# Patient Record
Sex: Female | Born: 1955 | Race: White | Hispanic: No | Smoking: Never smoker
Health system: Southern US, Community
[De-identification: ages and names within clinical notes are randomized; demographics above are authoritative.]

## PROBLEM LIST (undated history)

## (undated) DIAGNOSIS — K219 Gastro-esophageal reflux disease without esophagitis: Secondary | ICD-10-CM

## (undated) DIAGNOSIS — F419 Anxiety disorder, unspecified: Secondary | ICD-10-CM

## (undated) DIAGNOSIS — I10 Essential (primary) hypertension: Secondary | ICD-10-CM

## (undated) DIAGNOSIS — M1990 Unspecified osteoarthritis, unspecified site: Secondary | ICD-10-CM

## (undated) DIAGNOSIS — R011 Cardiac murmur, unspecified: Secondary | ICD-10-CM

## (undated) DIAGNOSIS — J45909 Unspecified asthma, uncomplicated: Secondary | ICD-10-CM

## (undated) DIAGNOSIS — M5430 Sciatica, unspecified side: Secondary | ICD-10-CM

## (undated) DIAGNOSIS — S3992XA Unspecified injury of lower back, initial encounter: Secondary | ICD-10-CM

## (undated) HISTORY — PX: BREAST BIOPSY: SHX20

## (undated) HISTORY — PX: TONSILLECTOMY: SUR1361

## (undated) HISTORY — DX: Gastro-esophageal reflux disease without esophagitis: K21.9

## (undated) HISTORY — DX: Sciatica, unspecified side: M54.30

## (undated) HISTORY — DX: Anxiety disorder, unspecified: F41.9

## (undated) HISTORY — DX: Cardiac murmur, unspecified: R01.1

## (undated) HISTORY — DX: Unspecified osteoarthritis, unspecified site: M19.90

## (undated) HISTORY — PX: ABDOMINAL HYSTERECTOMY: SHX81

## (undated) HISTORY — DX: Unspecified asthma, uncomplicated: J45.909

## (undated) HISTORY — DX: Essential (primary) hypertension: I10

## (undated) HISTORY — DX: Unspecified injury of lower back, initial encounter: S39.92XA

---

## 2014-10-07 HISTORY — PX: CATARACT EXTRACTION, BILATERAL: SHX1313

## 2017-10-01 ENCOUNTER — Encounter: Payer: Self-pay | Admitting: *Deleted

## 2017-10-02 ENCOUNTER — Other Ambulatory Visit: Payer: Self-pay

## 2017-10-02 ENCOUNTER — Ambulatory Visit (INDEPENDENT_AMBULATORY_CARE_PROVIDER_SITE_OTHER): Payer: Self-pay | Admitting: Cardiovascular Disease

## 2017-10-02 ENCOUNTER — Telehealth: Payer: Self-pay | Admitting: Cardiovascular Disease

## 2017-10-02 ENCOUNTER — Encounter: Payer: Self-pay | Admitting: Cardiovascular Disease

## 2017-10-02 VITALS — BP 166/80 | HR 55 | Ht 62.5 in | Wt 164.8 lb

## 2017-10-02 DIAGNOSIS — Z9289 Personal history of other medical treatment: Secondary | ICD-10-CM

## 2017-10-02 DIAGNOSIS — R0609 Other forms of dyspnea: Secondary | ICD-10-CM

## 2017-10-02 DIAGNOSIS — R9439 Abnormal result of other cardiovascular function study: Secondary | ICD-10-CM

## 2017-10-02 DIAGNOSIS — R011 Cardiac murmur, unspecified: Secondary | ICD-10-CM

## 2017-10-02 DIAGNOSIS — R079 Chest pain, unspecified: Secondary | ICD-10-CM

## 2017-10-02 MED ORDER — AMLODIPINE BESYLATE 5 MG PO TABS: 5.0000 mg | ORAL_TABLET | Freq: Every day | ORAL | 1 refills | Status: DC

## 2017-10-02 NOTE — Telephone Encounter (Signed)
Pre-cert Verification for the following procedure   Echo scheduled for 10/08/17

## 2017-10-02 NOTE — Progress Notes (Signed)
CARDIOLOGY CONSULT NOTE  Patient ID: Kendra Jones MRN: [id number] DOB/AGE: July 27, 1956 61 y.o.  Admit date: (Not on file) Primary Physician: Patient, No Pcp Per Referring Physician: [facility]  Reason for Consultation: Chest pain  HPI: Kendra Jones is a 61 y.o. female who is being seen today for the evaluation of chest pain at the request of [facility].  She does not have a primary care physician.  She was evaluated in the ED at [facility] on 09/14/17.  I personally reviewed all relevant documentation, labs, and studies.  On arrival, blood pressure was elevated at 174/79 with a heart rate of 63 bpm.  Oxygen saturations were normal on room air.  She was given aspirin and nitroglycerin.  Troponins were normal.  Additional relevant labs: BUN 22, creatinine 1.11, sodium 137, potassium 3.8, d-dimer 0.36.  An exercise treadmill stress test performed on 09/18/17 reportedly demonstrated 1 mm upsloping ST segment depression in leads II, aVF, V4, V5, and V6.  She exercised for 5 minutes achieving stage II.  Chest x-ray showed no active disease.  ECG performed on 09/14/17 which I personally interpreted demonstrated sinus rhythm with no ischemic ST segment or T wave abnormalities, nor any arrhythmias.  She tells me she has had shortness of breath and chest pain for the past 2 weeks.  She has put on 15 pounds in the last 6 months and gets very little activity.  She craves sweets.  She has had left-sided chest pressure as well as left infrascapular pain.  She is dyspneic with minimal exertion.  She denies leg swelling.  She said she had been lifting two 50 found bags of chicken feed and dog food prior to presenting to the ED and wonders if she pulled a muscle.  She has been experiencing headaches lately.  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  She denies any prior history of smoking.  She denies a history of diabetes.  She has been a CNA for 25 years.  She moved from  [place] ([place]) to [place] in March 2018.  Family history: Father had a pacemaker.  Mother had diabetes and coronary artery disease.   Allergies  Allergen Reactions  . Sulfa Antibiotics     Current Outpatient Medications  Medication Sig Dispense Refill  . aspirin EC 81 MG tablet Take 81 mg by mouth daily.    Marland Kitchen atenolol (TENORMIN) 25 MG tablet Take 25 mg by mouth daily.    Marland Kitchen amLODipine (NORVASC) 5 MG tablet Take 1 tablet (5 mg total) by mouth daily. 90 tablet 1   No current facility-administered medications for this visit.     Past Medical History:  Diagnosis Date  . Back injury    fracture in 1980 & 2007  . Heart murmur   . Hypertension   . Sciatica     Past Surgical History:  Procedure Laterality Date  . CESAREAN SECTION      Social History   Socioeconomic History  . Marital status: Widowed    Spouse name: Not on file  . Number of children: Not on file  . Years of education: Not on file  . Highest education level: Not on file  Social Needs  . Financial resource strain: Not on file  . Food insecurity - worry: Not on file  . Food insecurity - inability: Not on file  . Transportation needs - medical: Not on file  . Transportation needs - non-medical: Not  on file  Occupational History  . Not on file  Tobacco Use  . Smoking status: Never Smoker  . Smokeless tobacco: Never Used  Substance and Sexual Activity  . Alcohol use: Not on file  . Drug use: Not on file  . Sexual activity: Not on file  Other Topics Concern  . Not on file  Social History Narrative  . Not on file     Current Meds  Medication Sig  . aspirin EC 81 MG tablet Take 81 mg by mouth daily.  Marland Kitchen atenolol (TENORMIN) 25 MG tablet Take 25 mg by mouth daily.      Review of systems complete and found to be negative unless listed above in HPI    Physical exam Blood pressure (!) 166/80, pulse (!) 55, height 5' 2.5" (1.588 m), weight 164 lb 12.8 oz (74.8 kg), SpO2 98  %. General: NAD Neck: No JVD, no thyromegaly or thyroid nodule.  Lungs: Clear to auscultation bilaterally with normal respiratory effort. CV: Nondisplaced PMI. Bradycardic, regular rhythm, normal S1/S2, no H0/T8, 2/6 systolic murmur over RUSB.  No peripheral edema.  No carotid bruit.    Abdomen: Soft, nontender, no distention.  Skin: Intact without lesions or rashes.  Neurologic: Alert and oriented x 3.  Psych: Normal affect. Extremities: No clubbing or cyanosis.  HEENT: Normal.   ECG: Most recent ECG reviewed.   Labs: No results found for: K, BUN, CREATININE, ALT, TSH, HGB   Lipids: No results found for: LDLCALC, LDLDIRECT, CHOL, TRIG, HDL      ASSESSMENT AND PLAN:  1.  Chest pain and shortness of breath: I personally reviewed the ECG tracings from her stress test.  There were nonspecific ST segment abnormalities but no significant ischemic changes.  Risk factors for ischemic heart disease include hypertension.  I will first aim to control blood pressure with the addition of amlodipine 5 mg daily.  I will obtain an echocardiogram to evaluate cardiac structure and function.  I talked to her about the possibility of nuclear myocardial perfusion imaging but will hold off on this at present.  Continue atenolol 25 mg daily.  2.  Hypertension: She is currently on atenolol 25 mg daily.  I will add amlodipine 5 mg daily.  I have asked her to check her blood pressure 3 times per week for the next few weeks until she sees me again to see if further titration is necessary.  3.  Cardiac murmur: I will obtain an echocardiogram.     Disposition: Follow up in 1 month  Signed: Kate Sable, M.D., F.A.C.C.  10/02/2017, 1:56 PM

## 2017-10-02 NOTE — Patient Instructions (Signed)
Your physician recommends that you schedule a follow-up appointment in: 3-4 WEEKS WITH DR [facility]  Your physician has recommended you make the following change in your medication:   START AMLODIPINE 5 MG DAILY  Your physician has requested that you have an echocardiogram. Echocardiography is a painless test that uses sound waves to create images of your heart. It provides your doctor with information about the size and shape of your heart and how well your heart's chambers and valves are working. This procedure takes approximately one hour. There are no restrictions for this procedure.  Your physician has requested that you regularly monitor and record your blood pressure readings at home FOR 3 [place].   Thank you for choosing [facility]!!

## 2017-10-03 ENCOUNTER — Ambulatory Visit: Payer: Self-pay | Admitting: Cardiovascular Disease

## 2017-10-08 ENCOUNTER — Other Ambulatory Visit: Payer: Self-pay

## 2017-10-08 ENCOUNTER — Ambulatory Visit: Payer: Self-pay

## 2017-10-08 DIAGNOSIS — R011 Cardiac murmur, unspecified: Secondary | ICD-10-CM

## 2017-10-08 DIAGNOSIS — R079 Chest pain, unspecified: Secondary | ICD-10-CM

## 2017-10-13 ENCOUNTER — Telehealth: Payer: Self-pay | Admitting: Cardiovascular Disease

## 2017-10-13 NOTE — Telephone Encounter (Signed)
Called asking for results of Echo

## 2017-10-14 NOTE — Telephone Encounter (Signed)
Informed patient that results are on provider desktop for his review.  Will call as soon as soon as he signs off on the results.  She verbalized understanding.

## 2017-10-17 ENCOUNTER — Telehealth: Payer: Self-pay | Admitting: *Deleted

## 2017-10-17 NOTE — Telephone Encounter (Signed)
Notes recorded by Laurine Blazer, LPN on 10/17/347 at 6:11 AM EST Patient notified. No pcp listed. Follow up scheduled for 10/30/2017 with Dr. Bronson Ing. ------  Notes recorded by Laurine Blazer, LPN on 6/43/5391 at 2:25 AM EST Left message to return call.  ------  Notes recorded by Herminio Commons, MD on 10/14/2017 at 12:50 PM EST Normal pumping function. Mild calcium buildup on aortic valve.

## 2017-10-30 ENCOUNTER — Ambulatory Visit: Payer: Medicaid Other | Admitting: Cardiovascular Disease

## 2018-03-10 ENCOUNTER — Ambulatory Visit: Payer: Medicaid Other | Admitting: Physician Assistant

## 2018-03-10 ENCOUNTER — Encounter: Payer: Self-pay | Admitting: Physician Assistant

## 2018-03-10 VITALS — BP 152/84 | HR 58 | Temp 97.9°F | Ht 61.75 in | Wt 165.8 lb

## 2018-03-10 DIAGNOSIS — R059 Cough, unspecified: Secondary | ICD-10-CM

## 2018-03-10 DIAGNOSIS — Z131 Encounter for screening for diabetes mellitus: Secondary | ICD-10-CM

## 2018-03-10 DIAGNOSIS — I10 Essential (primary) hypertension: Secondary | ICD-10-CM

## 2018-03-10 DIAGNOSIS — R05 Cough: Secondary | ICD-10-CM

## 2018-03-10 DIAGNOSIS — J4521 Mild intermittent asthma with (acute) exacerbation: Secondary | ICD-10-CM

## 2018-03-10 DIAGNOSIS — M25552 Pain in left hip: Secondary | ICD-10-CM

## 2018-03-10 DIAGNOSIS — Z7689 Persons encountering health services in other specified circumstances: Secondary | ICD-10-CM

## 2018-03-10 DIAGNOSIS — Z13220 Encounter for screening for lipoid disorders: Secondary | ICD-10-CM

## 2018-03-10 DIAGNOSIS — R21 Rash and other nonspecific skin eruption: Secondary | ICD-10-CM

## 2018-03-10 MED ORDER — ATENOLOL 25 MG PO TABS: 25.0000 mg | ORAL_TABLET | Freq: Every day | ORAL | 1 refills | Status: DC

## 2018-03-10 MED ORDER — PREDNISONE 20 MG PO TABS: 20.0000 mg | ORAL_TABLET | Freq: Two times a day (BID) | ORAL | 0 refills | Status: DC

## 2018-03-10 MED ORDER — ALBUTEROL SULFATE (2.5 MG/3ML) 0.083% IN NEBU: 2.5000 mg | INHALATION_SOLUTION | Freq: Four times a day (QID) | RESPIRATORY_TRACT | 1 refills | Status: AC | PRN

## 2018-03-10 MED ORDER — AMLODIPINE BESYLATE 5 MG PO TABS: 5.0000 mg | ORAL_TABLET | Freq: Every day | ORAL | 1 refills | Status: DC

## 2018-03-10 MED ORDER — ALBUTEROL SULFATE HFA 108 (90 BASE) MCG/ACT IN AERS: 2.0000 | INHALATION_SPRAY | Freq: Four times a day (QID) | RESPIRATORY_TRACT | 0 refills | Status: DC | PRN

## 2018-03-10 MED ORDER — ALBUTEROL SULFATE (2.5 MG/3ML) 0.083% IN NEBU
2.5000 mg | INHALATION_SOLUTION | Freq: Once | RESPIRATORY_TRACT | Status: AC
  Administered 2018-03-10: 2.5 mg via RESPIRATORY_TRACT

## 2018-03-10 NOTE — Progress Notes (Signed)
BP (!) 152/84   Pulse (!) 58   Temp 97.9 F (36.6 C)   Ht 5' 1.75" (1.568 m)   Wt 165 lb 12 oz (75.2 kg)   SpO2 98%   BMI 30.56 kg/m    Subjective:    Patient ID: Kendra Jones, female    DOB: July 30, 1956, 62 y.o.   MRN: [id number]  HPI: Kendra Jones is a 62 y.o. female presenting on 03/10/2018 for New Patient (Initial Visit) (pt states she went to [place] in Dec. 2018. pt recently moved from [place] where she last had a PCP.)   HPI   Chief Complaint  Patient presents with  . New Patient (Initial Visit)    pt states she went to [facility] in Dec. 2018. pt recently moved from [place] where she last had a PCP.    Pt coughing for 3 weeks.   She tried her inhaler (before she ran out of it) but it didn't help.  She denies fevers.  She is a never smoker.  Pt was seen by cardiology in December for chest pain.  She says the pain resolved.  Pt stopped taking her amlodipine because she thought it was recalled.   Pt complains of L hip pain for years which is worsening.   She also has an itchy rash on her lower back that she thinks is getting better.   Relevant past medical, surgical, family and social history reviewed and updated as indicated. Interim medical history since our last visit reviewed. Allergies and medications reviewed and updated.   Current Outpatient Medications:  .  aspirin EC 81 MG tablet, Take 81 mg by mouth daily., Disp: , Rfl:  .  atenolol (TENORMIN) 25 MG tablet, Take 25 mg by mouth daily., Disp: , Rfl:  .  amLODipine (NORVASC) 5 MG tablet, Take 1 tablet (5 mg total) by mouth daily. (Patient not taking: Reported on 03/10/2018), Disp: 90 tablet, Rfl: 1   Review of Systems  Constitutional: Negative for appetite change, chills, diaphoresis, fatigue, fever and unexpected weight change.  HENT: Positive for sneezing and voice change. Negative for congestion, drooling, ear pain, facial swelling, hearing loss, mouth sores, sore throat and  trouble swallowing.   Eyes: Negative for pain, discharge, redness, itching and visual disturbance.  Respiratory: Positive for cough and wheezing. Negative for choking and shortness of breath.   Cardiovascular: Negative for chest pain, palpitations and leg swelling.  Gastrointestinal: Negative for abdominal pain, blood in stool, constipation, diarrhea and vomiting.  Endocrine: Negative for cold intolerance, heat intolerance and polydipsia.  Genitourinary: Negative for decreased urine volume, dysuria and hematuria.  Musculoskeletal: Positive for arthralgias, back pain and gait problem.  Skin: Positive for rash.  Allergic/Immunologic: Negative for environmental allergies.  Neurological: Positive for headaches. Negative for seizures, syncope and light-headedness.  Hematological: Negative for adenopathy.  Psychiatric/Behavioral: Positive for agitation. Negative for dysphoric mood and suicidal ideas. The patient is nervous/anxious.     Per HPI unless specifically indicated above     Objective:    BP (!) 152/84   Pulse (!) 58   Temp 97.9 F (36.6 C)   Ht 5' 1.75" (1.568 m)   Wt 165 lb 12 oz (75.2 kg)   SpO2 98%   BMI 30.56 kg/m   Wt Readings from Last 3 Encounters:  03/10/18 165 lb 12 oz (75.2 kg)  10/02/17 164 lb 12.8 oz (74.8 kg)    Physical Exam  Constitutional: She is oriented to person, place, and time. She appears  well-developed and well-nourished.  HENT:  Head: Normocephalic and atraumatic.  Mouth/Throat: Oropharynx is clear and moist. No oropharyngeal exudate.  Eyes: Pupils are equal, round, and reactive to light. Conjunctivae and EOM are normal.  Neck: Neck supple. No thyromegaly present.  Cardiovascular: Normal rate and regular rhythm.  Pulmonary/Chest: Effort normal and breath sounds normal. No respiratory distress. She has no wheezes. She has no rhonchi. She has no rales.  Cough significantly improved after nebulizer treatment  Abdominal: Soft. Bowel sounds are normal.  She exhibits no mass. There is no hepatosplenomegaly. There is no tenderness.  Musculoskeletal: She exhibits no edema.       Right hip: Normal.       Left hip: She exhibits decreased range of motion and tenderness. She exhibits no deformity.  Pain with ROM testing L hip, all directions  Lymphadenopathy:    She has no cervical adenopathy.  Neurological: She is alert and oriented to person, place, and time. Gait normal.  Skin: Skin is warm and dry. Rash noted.     Dry rashy area lower back- appears to be healing. No infection or discrete lesions  Psychiatric: She has a normal mood and affect. Her behavior is normal.  Vitals reviewed.      No results found for this or any previous visit.    Assessment & Plan:    Encounter Diagnoses  Name Primary?  . Encounter to establish care Yes  . Essential hypertension   . Intermittent asthma with acute exacerbation, unspecified asthma severity   . Left hip pain   . Cough   . Rash   . Screening cholesterol level   . Screening for diabetes mellitus     -Baseline labs -Xray L hip -Albuterol and prednisone for asthma flare -pt to restart amlodipine. Continue atenolol -pt was given [employer] application -pt to follow up 1 month.  RTO sooner prn worsening or new symptoms

## 2018-03-11 ENCOUNTER — Ambulatory Visit (HOSPITAL_COMMUNITY)
Admission: RE | Admit: 2018-03-11 | Discharge: 2018-03-11 | Disposition: A | Discharge status: Home / Self Care | Payer: Medicaid Other | Source: Ambulatory Visit | Attending: Physician Assistant | Admitting: Physician Assistant

## 2018-03-11 ENCOUNTER — Encounter (HOSPITAL_COMMUNITY): Payer: Self-pay | Admitting: Radiology

## 2018-03-11 ENCOUNTER — Other Ambulatory Visit: Payer: Self-pay | Admitting: Physician Assistant

## 2018-03-11 DIAGNOSIS — M25552 Pain in left hip: Secondary | ICD-10-CM | POA: Insufficient documentation

## 2018-03-11 MED ORDER — ALBUTEROL SULFATE HFA 108 (90 BASE) MCG/ACT IN AERS: 2.0000 | INHALATION_SPRAY | Freq: Four times a day (QID) | RESPIRATORY_TRACT | 0 refills | Status: DC | PRN

## 2018-03-11 MED ORDER — ALBUTEROL SULFATE HFA 108 (90 BASE) MCG/ACT IN AERS: 2.0000 | INHALATION_SPRAY | Freq: Four times a day (QID) | RESPIRATORY_TRACT | 0 refills | Status: AC | PRN

## 2018-03-12 ENCOUNTER — Other Ambulatory Visit (HOSPITAL_COMMUNITY)
Admission: RE | Admit: 2018-03-12 | Discharge: 2018-03-12 | Disposition: A | Discharge status: Home / Self Care | Payer: Medicaid Other | Source: Ambulatory Visit | Attending: Physician Assistant | Admitting: Physician Assistant

## 2018-03-12 DIAGNOSIS — Z13220 Encounter for screening for lipoid disorders: Secondary | ICD-10-CM

## 2018-03-12 DIAGNOSIS — Z131 Encounter for screening for diabetes mellitus: Secondary | ICD-10-CM | POA: Insufficient documentation

## 2018-03-12 LAB — COMPREHENSIVE METABOLIC PANEL
ALBUMIN: 4.3 g/dL (ref 3.5–5.0)
ALK PHOS: 71 U/L (ref 38–126)
ALT: 29 U/L (ref 14–54)
ANION GAP: 8 (ref 5–15)
AST: 24 U/L (ref 15–41)
BUN: 22 mg/dL — ABNORMAL HIGH (ref 6–20)
CALCIUM: 9.8 mg/dL (ref 8.9–10.3)
CO2: 23 mmol/L (ref 22–32)
Chloride: 108 mmol/L (ref 101–111)
Creatinine, Ser: 1.25 mg/dL — ABNORMAL HIGH (ref 0.44–1.00)
GFR calc Af Amer: 52 mL/min — ABNORMAL LOW (ref >60)
GFR calc non Af Amer: 45 mL/min — ABNORMAL LOW (ref >60)
GLUCOSE: 143 mg/dL — AB (ref 65–99)
Potassium: 4.4 mmol/L (ref 3.5–5.1)
Sodium: 139 mmol/L (ref 135–145)
Total Bilirubin: 0.6 mg/dL (ref 0.3–1.2)
Total Protein: 7.7 g/dL (ref 6.5–8.1)

## 2018-03-12 LAB — LIPID PANEL
CHOL/HDL RATIO: 3.1 ratio
Cholesterol: 218 mg/dL — ABNORMAL HIGH (ref 0–200)
HDL: 70 mg/dL (ref >40)
LDL CALC: 137 mg/dL — AB (ref 0–99)
Triglycerides: 53 mg/dL (ref <150)
VLDL: 11 mg/dL (ref 0–40)

## 2018-03-12 LAB — HEMOGLOBIN A1C
Hgb A1c MFr Bld: 5.1 % (ref 4.8–5.6)
Mean Plasma Glucose: 99.67 mg/dL

## 2018-03-26 ENCOUNTER — Ambulatory Visit: Payer: Medicaid Other | Admitting: Physician Assistant

## 2018-03-26 ENCOUNTER — Encounter: Payer: Self-pay | Admitting: Physician Assistant

## 2018-03-26 VITALS — BP 140/72 | HR 63 | Temp 97.9°F | Ht 61.75 in | Wt 166.0 lb

## 2018-03-26 DIAGNOSIS — Z1239 Encounter for other screening for malignant neoplasm of breast: Secondary | ICD-10-CM

## 2018-03-26 DIAGNOSIS — R21 Rash and other nonspecific skin eruption: Secondary | ICD-10-CM

## 2018-03-26 DIAGNOSIS — Z1211 Encounter for screening for malignant neoplasm of colon: Secondary | ICD-10-CM

## 2018-03-26 DIAGNOSIS — E785 Hyperlipidemia, unspecified: Secondary | ICD-10-CM

## 2018-03-26 DIAGNOSIS — M25552 Pain in left hip: Secondary | ICD-10-CM

## 2018-03-26 DIAGNOSIS — I10 Essential (primary) hypertension: Secondary | ICD-10-CM

## 2018-03-26 MED ORDER — TRIAMCINOLONE ACETONIDE 0.1 % EX CREA: 1.0000 "application " | TOPICAL_CREAM | Freq: Two times a day (BID) | CUTANEOUS | 0 refills | Status: DC

## 2018-03-26 MED ORDER — AMLODIPINE BESYLATE 10 MG PO TABS: 10.0000 mg | ORAL_TABLET | Freq: Every day | ORAL | 3 refills | Status: DC

## 2018-03-26 NOTE — Patient Instructions (Signed)

## 2018-03-26 NOTE — Progress Notes (Signed)
BP 140/72 (BP Location: Left Arm, Patient Position: Sitting, Cuff Size: Normal)   Pulse 63   Temp 97.9 F (36.6 C) (Other (Comment))   Ht 5' 1.75" (1.568 m)   Wt 166 lb (75.3 kg)   SpO2 96%   BMI 30.61 kg/m    Subjective:    Patient ID: Kendra Jones, female    DOB: 1956-04-07, 62 y.o.   MRN: [id number]  HPI: Kendra Jones is a 62 y.o. female presenting on 03/26/2018 for Asthma; Hip Pain; and Rash   HPI   Pt thinks her rash is staying the same.   Pt says hip pain is not any better with course of prednisone.  xrays unremarkable.   Her breathing is improved- after prednisone and albuterol  Pt did not yet turn in her [employer] application  Relevant past medical, surgical, family and social history reviewed and updated as indicated. Interim medical history since our last visit reviewed. Allergies and medications reviewed and updated.   Current Outpatient Medications:  .  albuterol (PROVENTIL HFA;VENTOLIN HFA) 108 (90 Base) MCG/ACT inhaler, Inhale 2 puffs into the lungs every 6 (six) hours as needed for wheezing or shortness of breath., Disp: 1 Inhaler, Rfl: 0 .  albuterol (PROVENTIL) (2.5 MG/3ML) 0.083% nebulizer solution, Take 3 mLs (2.5 mg total) by nebulization every 6 (six) hours as needed for wheezing or shortness of breath., Disp: 25 vial, Rfl: 1 .  amLODipine (NORVASC) 5 MG tablet, Take 1 tablet (5 mg total) by mouth daily., Disp: 30 tablet, Rfl: 1 .  aspirin EC 81 MG tablet, Take 81 mg by mouth daily., Disp: , Rfl:  .  atenolol (TENORMIN) 25 MG tablet, Take 1 tablet (25 mg total) by mouth daily., Disp: 30 tablet, Rfl: 1   Review of Systems  Constitutional: Positive for fatigue. Negative for appetite change, chills, diaphoresis, fever and unexpected weight change.  HENT: Negative for congestion, dental problem, drooling, ear pain, facial swelling, hearing loss, mouth sores, sneezing, sore throat, trouble swallowing and voice change.   Eyes: Negative for pain,  discharge, redness, itching and visual disturbance.  Respiratory: Positive for cough. Negative for choking, shortness of breath and wheezing.   Cardiovascular: Negative for chest pain, palpitations and leg swelling.  Gastrointestinal: Negative for abdominal pain, blood in stool, constipation, diarrhea and vomiting.  Endocrine: Negative for cold intolerance, heat intolerance and polydipsia.  Genitourinary: Negative for decreased urine volume, dysuria and hematuria.  Musculoskeletal: Positive for arthralgias, back pain and gait problem.  Skin: Positive for rash.  Allergic/Immunologic: Negative for environmental allergies.  Neurological: Positive for headaches. Negative for seizures, syncope and light-headedness.  Hematological: Negative for adenopathy.  Psychiatric/Behavioral: Negative for agitation, dysphoric mood and suicidal ideas. The patient is not nervous/anxious.     Per HPI unless specifically indicated above     Objective:    BP 140/72 (BP Location: Left Arm, Patient Position: Sitting, Cuff Size: Normal)   Pulse 63   Temp 97.9 F (36.6 C) (Other (Comment))   Ht 5' 1.75" (1.568 m)   Wt 166 lb (75.3 kg)   SpO2 96%   BMI 30.61 kg/m   Wt Readings from Last 3 Encounters:  03/26/18 166 lb (75.3 kg)  03/10/18 165 lb 12 oz (75.2 kg)  10/02/17 164 lb 12.8 oz (74.8 kg)    Physical Exam  Constitutional: She is oriented to person, place, and time. She appears well-developed and well-nourished.  HENT:  Head: Normocephalic and atraumatic.  Neck: Neck supple.  Cardiovascular: Normal  rate and regular rhythm.  Pulmonary/Chest: Effort normal and breath sounds normal.  Abdominal: Soft. Bowel sounds are normal. She exhibits no mass. There is no hepatosplenomegaly. There is no tenderness.  Musculoskeletal: She exhibits no edema.       Right hip: Normal.       Left hip: She exhibits decreased range of motion. She exhibits no swelling.  Pain with ROM testing all directions    Lymphadenopathy:    She has no cervical adenopathy.  Neurological: She is alert and oriented to person, place, and time.  Skin: Skin is warm and dry. Rash noted.     Psychiatric: She has a normal mood and affect. Her behavior is normal.  Vitals reviewed.   Results for orders placed or performed during the hospital encounter of 03/12/18  Hemoglobin A1c  Result Value Ref Range   Hgb A1c MFr Bld 5.1 4.8 - 5.6 %   Mean Plasma Glucose 99.67 mg/dL  Comprehensive metabolic panel  Result Value Ref Range   Sodium 139 135 - 145 mmol/L   Potassium 4.4 3.5 - 5.1 mmol/L   Chloride 108 101 - 111 mmol/L   CO2 23 22 - 32 mmol/L   Glucose, Bld 143 (H) 65 - 99 mg/dL   BUN 22 (H) 6 - 20 mg/dL   Creatinine, Ser 1.25 (H) 0.44 - 1.00 mg/dL   Calcium 9.8 8.9 - 10.3 mg/dL   Total Protein 7.7 6.5 - 8.1 g/dL   Albumin 4.3 3.5 - 5.0 g/dL   AST 24 15 - 41 U/L   ALT 29 14 - 54 U/L   Alkaline Phosphatase 71 38 - 126 U/L   Total Bilirubin 0.6 0.3 - 1.2 mg/dL   GFR calc non Af Amer 45 (L) >60 mL/min   GFR calc Af Amer 52 (L) >60 mL/min   Anion gap 8 5 - 15  Lipid panel  Result Value Ref Range   Cholesterol 218 (H) 0 - 200 mg/dL   Triglycerides 53 <150 mg/dL   HDL 70 >40 mg/dL   Total CHOL/HDL Ratio 3.1 RATIO   VLDL 11 0 - 40 mg/dL   LDL Cholesterol 137 (H) 0 - 99 mg/dL      Assessment & Plan:   Encounter Diagnoses  Name Primary?  . Essential hypertension Yes  . Hyperlipidemia, unspecified hyperlipidemia type   . Screening for breast cancer   . Screening for colon cancer   . Left hip pain   . Rash     -reviewed labs with pt -Increase amlodipine and continue atenolol 25mg  -Discussed lipids and pt wishes to try to lower with lowfat diet and exercise.  Counseled pt on lowfat diet and exercise and gave handout -ordered screening Mammogram -pt was given iFOBT for colon cancer screening -will Refer to orthopedics for persistent for L hip pain -rx TAC cream for rash -encouraged pt to turn in  [employer] application -pt to Follow up 1 month to recheck blood pressure.  RTO sooner prn

## 2018-03-27 ENCOUNTER — Other Ambulatory Visit: Payer: Self-pay | Admitting: Physician Assistant

## 2018-03-27 ENCOUNTER — Other Ambulatory Visit: Payer: Self-pay | Admitting: Obstetrics & Gynecology

## 2018-03-27 DIAGNOSIS — Z1231 Encounter for screening mammogram for malignant neoplasm of breast: Secondary | ICD-10-CM

## 2018-04-06 ENCOUNTER — Other Ambulatory Visit: Payer: Self-pay | Admitting: Physician Assistant

## 2018-04-06 DIAGNOSIS — Z1211 Encounter for screening for malignant neoplasm of colon: Secondary | ICD-10-CM

## 2018-04-06 DIAGNOSIS — E785 Hyperlipidemia, unspecified: Secondary | ICD-10-CM | POA: Insufficient documentation

## 2018-04-13 ENCOUNTER — Ambulatory Visit: Payer: Medicaid Other | Admitting: Physician Assistant

## 2018-04-14 LAB — IFOBT (OCCULT BLOOD): IFOBT: NEGATIVE

## 2018-04-16 ENCOUNTER — Ambulatory Visit
Admission: RE | Admit: 2018-04-16 | Discharge: 2018-04-16 | Disposition: A | Discharge status: Home / Self Care | Payer: No Typology Code available for payment source | Source: Ambulatory Visit | Attending: Physician Assistant | Admitting: Physician Assistant

## 2018-04-16 DIAGNOSIS — Z1231 Encounter for screening mammogram for malignant neoplasm of breast: Secondary | ICD-10-CM

## 2018-04-21 ENCOUNTER — Encounter: Payer: Self-pay | Admitting: Physician Assistant

## 2018-04-21 ENCOUNTER — Ambulatory Visit: Payer: Medicaid Other | Admitting: Physician Assistant

## 2018-04-21 VITALS — BP 130/76 | HR 74 | Temp 97.9°F | Ht 61.75 in | Wt 166.5 lb

## 2018-04-21 DIAGNOSIS — E785 Hyperlipidemia, unspecified: Secondary | ICD-10-CM

## 2018-04-21 DIAGNOSIS — I10 Essential (primary) hypertension: Secondary | ICD-10-CM

## 2018-04-21 DIAGNOSIS — Z029 Encounter for administrative examinations, unspecified: Secondary | ICD-10-CM

## 2018-04-21 DIAGNOSIS — R21 Rash and other nonspecific skin eruption: Secondary | ICD-10-CM

## 2018-04-21 NOTE — Progress Notes (Signed)
BP 130/76 (BP Location: Right Arm, Patient Position: Sitting, Cuff Size: Normal)   Pulse 74   Temp 97.9 F (36.6 C) (Other (Comment))   Ht 5' 1.75" (1.568 m)   Wt 166 lb 8 oz (75.5 kg)   SpO2 97%   BMI 30.70 kg/m    Subjective:    Patient ID: Kendra Jones, female    DOB: 11-12-1955, 62 y.o.   MRN: [id number]  HPI: Kendra Jones is a 62 y.o. female presenting on 04/21/2018 for Hypertension   HPI   Pt has turned in her [employer] application  She has a form for social services that she wants to have signed.   She says her rash clears up but the after she stops using the TAC, the rash returns  She is feeling well.  Relevant past medical, surgical, family and social history reviewed and updated as indicated. Interim medical history since our last visit reviewed. Allergies and medications reviewed and updated.   Current Outpatient Medications:  .  albuterol (PROVENTIL HFA;VENTOLIN HFA) 108 (90 Base) MCG/ACT inhaler, Inhale 2 puffs into the lungs every 6 (six) hours as needed for wheezing or shortness of breath., Disp: 1 Inhaler, Rfl: 0 .  albuterol (PROVENTIL) (2.5 MG/3ML) 0.083% nebulizer solution, Take 3 mLs (2.5 mg total) by nebulization every 6 (six) hours as needed for wheezing or shortness of breath., Disp: 25 vial, Rfl: 1 .  amLODipine (NORVASC) 10 MG tablet, Take 1 tablet (10 mg total) by mouth daily., Disp: 30 tablet, Rfl: 3 .  aspirin EC 81 MG tablet, Take 81 mg by mouth daily., Disp: , Rfl:  .  atenolol (TENORMIN) 25 MG tablet, Take 1 tablet (25 mg total) by mouth daily., Disp: 30 tablet, Rfl: 1 .  triamcinolone cream (KENALOG) 0.1 %, Apply 1 application topically 2 (two) times daily. (Patient not taking: Reported on 04/21/2018), Disp: 30 g, Rfl: 0   Review of Systems  HENT: Negative for dental problem.   Musculoskeletal: Positive for arthralgias and back pain.  Skin: Positive for rash.  Allergic/Immunologic: Positive for environmental allergies.   Neurological: Positive for headaches (sometimes).    Per HPI unless specifically indicated above     Objective:    BP 130/76 (BP Location: Right Arm, Patient Position: Sitting, Cuff Size: Normal)   Pulse 74   Temp 97.9 F (36.6 C) (Other (Comment))   Ht 5' 1.75" (1.568 m)   Wt 166 lb 8 oz (75.5 kg)   SpO2 97%   BMI 30.70 kg/m   Wt Readings from Last 3 Encounters:  04/21/18 166 lb 8 oz (75.5 kg)  03/26/18 166 lb (75.3 kg)  03/10/18 165 lb 12 oz (75.2 kg)    Physical Exam  Constitutional: She is oriented to person, place, and time. She appears well-developed and well-nourished.  HENT:  Head: Normocephalic and atraumatic.  Neck: Neck supple.  Cardiovascular: Normal rate and regular rhythm.  Pulmonary/Chest: Effort normal and breath sounds normal.  Abdominal: Soft. Bowel sounds are normal. She exhibits no mass. There is no hepatosplenomegaly. There is no tenderness.  Musculoskeletal: She exhibits no edema.  Lymphadenopathy:    She has no cervical adenopathy.  Neurological: She is alert and oriented to person, place, and time.  Skin: Skin is warm and dry.  Psychiatric: She has a normal mood and affect. Her behavior is normal.  Vitals reviewed.   Results for orders placed or performed in visit on 04/06/18  IFOBT POC (occult bld, rslt in office)  Result  Value Ref Range   IFOBT Negative       Assessment & Plan:   Encounter Diagnoses  Name Primary?  . Essential hypertension Yes  . Hyperlipidemia, unspecified hyperlipidemia type   . Rash   . Encounters for administrative purpose     -reviewed results iFOBT with pt -pt counseled to continue to watch lowfat diet -pt to continue current meds for htn -pt counseled to use the TAC for a week after the rash has resolved to make sure it is fully cleared and won't reflare -discussed with pt that she has already filled out (incorrectly) the form so I cannot fill it out or sign it.  She says she will drop off another form -pt  to follow up 2 months.  RTO sooner prn

## 2018-06-04 ENCOUNTER — Telehealth: Payer: Self-pay | Admitting: Orthopedic Surgery

## 2018-06-04 NOTE — Telephone Encounter (Signed)
Referral received from patient's primary care at [facility] for problem of left hip pain; please review notes/films, and please advise regarding scheduling.

## 2018-06-05 NOTE — Telephone Encounter (Signed)
OK TO SEE

## 2018-06-05 NOTE — Telephone Encounter (Signed)
Called back to patient to offer appointment.

## 2018-06-23 ENCOUNTER — Other Ambulatory Visit: Payer: Self-pay | Admitting: Physician Assistant

## 2018-06-23 ENCOUNTER — Encounter: Payer: Self-pay | Admitting: Physician Assistant

## 2018-06-23 ENCOUNTER — Other Ambulatory Visit (HOSPITAL_COMMUNITY)
Admission: RE | Admit: 2018-06-23 | Discharge: 2018-06-23 | Disposition: A | Discharge status: Home / Self Care | Payer: No Typology Code available for payment source | Source: Ambulatory Visit | Attending: Physician Assistant | Admitting: Physician Assistant

## 2018-06-23 ENCOUNTER — Ambulatory Visit: Payer: Medicaid Other | Admitting: Physician Assistant

## 2018-06-23 VITALS — BP 128/72 | HR 58 | Temp 97.9°F | Ht 61.75 in | Wt 164.0 lb

## 2018-06-23 DIAGNOSIS — I10 Essential (primary) hypertension: Secondary | ICD-10-CM | POA: Insufficient documentation

## 2018-06-23 DIAGNOSIS — E785 Hyperlipidemia, unspecified: Secondary | ICD-10-CM

## 2018-06-23 DIAGNOSIS — M25552 Pain in left hip: Secondary | ICD-10-CM

## 2018-06-23 DIAGNOSIS — J4521 Mild intermittent asthma with (acute) exacerbation: Secondary | ICD-10-CM

## 2018-06-23 LAB — COMPREHENSIVE METABOLIC PANEL
ALBUMIN: 4.3 g/dL (ref 3.5–5.0)
ALK PHOS: 77 U/L (ref 38–126)
ALT: 26 U/L (ref 0–44)
AST: 20 U/L (ref 15–41)
Anion gap: 7 (ref 5–15)
BUN: 17 mg/dL (ref 8–23)
CALCIUM: 9.5 mg/dL (ref 8.9–10.3)
CO2: 26 mmol/L (ref 22–32)
CREATININE: 1.41 mg/dL — AB (ref 0.44–1.00)
Chloride: 109 mmol/L (ref 98–111)
GFR calc Af Amer: 45 mL/min — ABNORMAL LOW (ref >60)
GFR calc non Af Amer: 39 mL/min — ABNORMAL LOW (ref >60)
Glucose, Bld: 109 mg/dL — ABNORMAL HIGH (ref 70–99)
Potassium: 4.4 mmol/L (ref 3.5–5.1)
SODIUM: 142 mmol/L (ref 135–145)
TOTAL PROTEIN: 7.6 g/dL (ref 6.5–8.1)
Total Bilirubin: 0.7 mg/dL (ref 0.3–1.2)

## 2018-06-23 LAB — LIPID PANEL
Cholesterol: 176 mg/dL (ref 0–200)
HDL: 50 mg/dL (ref >40)
LDL Cholesterol: 103 mg/dL — ABNORMAL HIGH (ref 0–99)
Total CHOL/HDL Ratio: 3.5 RATIO
Triglycerides: 116 mg/dL (ref <150)
VLDL: 23 mg/dL (ref 0–40)

## 2018-06-23 MED ORDER — AMLODIPINE BESYLATE 10 MG PO TABS: 10.0000 mg | ORAL_TABLET | Freq: Every day | ORAL | 1 refills | Status: DC

## 2018-06-23 MED ORDER — ATENOLOL 25 MG PO TABS: 25.0000 mg | ORAL_TABLET | Freq: Every day | ORAL | 1 refills | Status: DC

## 2018-06-23 NOTE — Progress Notes (Signed)
BP 128/72   Pulse (!) 58   Temp 97.9 F (36.6 C)   Ht 5' 1.75" (1.568 m)   Wt 164 lb (74.4 kg)   SpO2 97%   BMI 30.24 kg/m    Subjective:    Patient ID: Kendra Jones, female    DOB: September 01, 1956, 62 y.o.   MRN: [id number]  HPI: Kendra Jones is a 62 y.o. female presenting on 06/23/2018 for Hypertension and Hyperlipidemia   HPI   Pt says she got approved for [facility].  She says she got call from orthopedics- she was referred in June for hip- but she needs to call them back to schedule appointment.  Pt didn't get labs drawn  Relevant past medical, surgical, family and social history reviewed and updated as indicated. Interim medical history since our last visit reviewed. Allergies and medications reviewed and updated.   Current Outpatient Medications:  .  albuterol (PROVENTIL HFA;VENTOLIN HFA) 108 (90 Base) MCG/ACT inhaler, Inhale 2 puffs into the lungs every 6 (six) hours as needed for wheezing or shortness of breath., Disp: 1 Inhaler, Rfl: 0 .  albuterol (PROVENTIL) (2.5 MG/3ML) 0.083% nebulizer solution, Take 3 mLs (2.5 mg total) by nebulization every 6 (six) hours as needed for wheezing or shortness of breath., Disp: 25 vial, Rfl: 1 .  amLODipine (NORVASC) 10 MG tablet, Take 1 tablet (10 mg total) by mouth daily., Disp: 30 tablet, Rfl: 3 .  aspirin EC 81 MG tablet, Take 81 mg by mouth daily., Disp: , Rfl:  .  atenolol (TENORMIN) 25 MG tablet, Take 1 tablet (25 mg total) by mouth daily., Disp: 30 tablet, Rfl: 1 .  triamcinolone cream (KENALOG) 0.1 %, Apply 1 application topically 2 (two) times daily. (Patient not taking: Reported on 04/21/2018), Disp: 30 g, Rfl: 0  Review of Systems  Constitutional: Negative for appetite change, chills, diaphoresis, fatigue, fever and unexpected weight change.  HENT: Negative for congestion, dental problem, drooling, ear pain, facial swelling, hearing loss, mouth sores, sneezing, sore throat, trouble swallowing and voice change.   Eyes:  Negative for pain, discharge, redness, itching and visual disturbance.  Respiratory: Negative for cough, choking, shortness of breath and wheezing.   Cardiovascular: Negative for chest pain, palpitations and leg swelling.  Gastrointestinal: Negative for abdominal pain, blood in stool, constipation, diarrhea and vomiting.  Endocrine: Negative for cold intolerance, heat intolerance and polydipsia.  Genitourinary: Negative for decreased urine volume, dysuria and hematuria.  Musculoskeletal: Positive for arthralgias, back pain and gait problem.  Skin: Negative for rash.  Allergic/Immunologic: Negative for environmental allergies.  Neurological: Positive for headaches. Negative for seizures, syncope and light-headedness.  Hematological: Negative for adenopathy.  Psychiatric/Behavioral: Negative for agitation, dysphoric mood and suicidal ideas. The patient is not nervous/anxious.     Per HPI unless specifically indicated above     Objective:    BP 128/72   Pulse (!) 58   Temp 97.9 F (36.6 C)   Ht 5' 1.75" (1.568 m)   Wt 164 lb (74.4 kg)   SpO2 97%   BMI 30.24 kg/m   Wt Readings from Last 3 Encounters:  06/23/18 164 lb (74.4 kg)  04/21/18 166 lb 8 oz (75.5 kg)  03/26/18 166 lb (75.3 kg)    Physical Exam  Constitutional: She is oriented to person, place, and time. She appears well-developed and well-nourished.  HENT:  Head: Normocephalic and atraumatic.  Neck: Neck supple.  Cardiovascular: Normal rate and regular rhythm.  Pulmonary/Chest: Effort normal and breath sounds normal.  Abdominal: Soft. Bowel sounds are normal. She exhibits no mass. There is no hepatosplenomegaly. There is no tenderness.  Musculoskeletal: She exhibits no edema.  Lymphadenopathy:    She has no cervical adenopathy.  Neurological: She is alert and oriented to person, place, and time.  Skin: Skin is warm and dry.  Psychiatric: She has a normal mood and affect. Her behavior is normal.  Vitals  reviewed.        Assessment & Plan:    Encounter Diagnoses  Name Primary?  . Essential hypertension Yes  . Hyperlipidemia, unspecified hyperlipidemia type   . Left hip pain   . Intermittent asthma with acute exacerbation, unspecified asthma severity     -pt to Get labs drawn- will call with results -No changes to meds today -pt will contact orthopedist to schedule appointment for her hip -pt to follow up 3 months.  RTO sooner prn

## 2018-07-13 ENCOUNTER — Encounter: Payer: Self-pay | Admitting: Orthopedic Surgery

## 2018-07-13 ENCOUNTER — Ambulatory Visit (INDEPENDENT_AMBULATORY_CARE_PROVIDER_SITE_OTHER): Payer: Self-pay | Admitting: Orthopedic Surgery

## 2018-07-13 ENCOUNTER — Ambulatory Visit (HOSPITAL_COMMUNITY)
Admission: RE | Admit: 2018-07-13 | Discharge: 2018-07-13 | Disposition: A | Discharge status: Home / Self Care | Payer: No Typology Code available for payment source | Source: Ambulatory Visit | Attending: Orthopedic Surgery | Admitting: Orthopedic Surgery

## 2018-07-13 VITALS — BP 150/82 | HR 63 | Ht 62.0 in | Wt 168.0 lb

## 2018-07-13 DIAGNOSIS — M79604 Pain in right leg: Secondary | ICD-10-CM

## 2018-07-13 DIAGNOSIS — M79605 Pain in left leg: Secondary | ICD-10-CM

## 2018-07-13 DIAGNOSIS — M5136 Other intervertebral disc degeneration, lumbar region: Secondary | ICD-10-CM | POA: Insufficient documentation

## 2018-07-13 DIAGNOSIS — M545 Low back pain: Secondary | ICD-10-CM | POA: Insufficient documentation

## 2018-07-13 DIAGNOSIS — X58XXXA Exposure to other specified factors, initial encounter: Secondary | ICD-10-CM | POA: Insufficient documentation

## 2018-07-13 DIAGNOSIS — S32059A Unspecified fracture of fifth lumbar vertebra, initial encounter for closed fracture: Secondary | ICD-10-CM | POA: Insufficient documentation

## 2018-07-13 NOTE — Progress Notes (Signed)
NEW PATIENT OFFICE VISIT  Chief Complaint  Patient presents with  . Back Pain    back pain buttock pain down both legs  . Hip Pain    left groin and right ischial pain    61 year old female presents after falling a year ago injured her lower back she says she went to the emergency room she was told she was fine however she had to go back then she was told she had sciatica.  She says she is never really recovered presents to [place] with bilateral lower back pain radiating to both hips and right groin her x-rays have been negative except for an old fracture at L5 her hip x-rays are normal  She says she has severe pain which is agonizing it is preventing her from sitting dressing bending.  Pain location lower back both hips radiates to both right groin and both legs but not below the knee Time 1 year Quality burning Severity8/10    Review of Systems  Constitutional: Negative for chills, fever, malaise/fatigue and weight loss.  Respiratory: Positive for shortness of breath.   Cardiovascular: Negative for chest pain.  Gastrointestinal: Negative for constipation.       Denies loss bowel control   Genitourinary:       Denies urinary retention or los of bladder control   Skin: Negative.   Neurological: Negative for tingling and sensory change.     Past Medical History:  Diagnosis Date  . Anxiety   . Asthma   . Back injury    fracture in 1980 & 2007  . GERD (gastroesophageal reflux disease)   . Heart murmur   . Hypertension   . Sciatica     Past Surgical History:  Procedure Laterality Date  . ABDOMINAL HYSTERECTOMY    . BREAST BIOPSY Right   . CATARACT EXTRACTION, BILATERAL  2016  . CESAREAN SECTION  12-26-1988, 04-22-1991   x2  . TONSILLECTOMY Bilateral age 70    Family History  Problem Relation Age of Onset  . CAD Other   . Diabetes Mother   . Anemia Mother   . Heart disease Mother   . Kidney disease Father   . Heart disease Father   . Hyperlipidemia Father   .  Hypertension Sister   . Hypertension Brother   . Heart attack Maternal Grandmother   . Cancer Maternal Grandfather   . Aneurysm Paternal Grandmother   . Cancer Paternal Grandfather        stomach  . Hypertension Brother   . Hypertension Brother   . Hypertension Brother    Social History   Tobacco Use  . Smoking status: Never Smoker  . Smokeless tobacco: Never Used  Substance Use Topics  . Alcohol use: Never    Frequency: Never  . Drug use: Never    Allergies  Allergen Reactions  . Sulfa Antibiotics Rash    Current Meds  Medication Sig  . amLODipine (NORVASC) 10 MG tablet Take 1 tablet (10 mg total) by mouth daily.  Marland Kitchen aspirin EC 81 MG tablet Take 81 mg by mouth daily.  Marland Kitchen atenolol (TENORMIN) 25 MG tablet Take 1 tablet (25 mg total) by mouth daily.    BP (!) 150/82   Pulse 63   Ht 5\' 2"  (1.575 m)   Wt 168 lb (76.2 kg)   BMI 30.73 kg/m   Physical Exam  Constitutional: She is oriented to person, place, and time. She appears well-developed and well-nourished.  Neurological: She is alert and oriented  to person, place, and time.  Psychiatric: She has a normal mood and affect. Judgment normal.  Vitals reviewed.   Right Hip Exam  Right hip exam is normal.   Tenderness  The patient is experiencing no tenderness.   Range of Motion  The patient has normal right hip ROM.  Muscle Strength  The patient has normal right hip strength.  Tests  FABER: negative  Other  Erythema: absent Sensation: normal Pulse: present  Comments:  HIP STABILITY NORMAL    Left Hip Exam  Left hip exam is normal.  Tenderness  The patient is experiencing no tenderness.   Range of Motion  The patient has normal left hip ROM.  Muscle Strength  The patient has normal left hip strength.   Tests  FABER: negative  Other  Erythema: absent Sensation: normal Pulse: present  Comments:  HIP STABILITY NORMAL    Back Exam   Tenderness  The patient is experiencing tenderness  in the lumbar.  Muscle Strength  Right Quadriceps:  5/5  Left Quadriceps:  5/5  Right Hamstrings:  5/5  Left Hamstrings:  5/5   Reflexes  Patellar: normal Achilles: normal Babinski's sign: normal   Comments:  Straight leg raises are negative on both sides except for some tightness and pulling on the right hip with straight leg raise on the right        MEDICAL DECISION SECTION  Xrays were done at [facility]   My independent reading of xrays:  Old L5 fracture otherwise normal lumbar spine  Pelvic x-ray and AP lateral left hip show no fracture dislocation or arthritis  Encounter Diagnosis  Name Primary?  . Lumbar pain with radiation down both legs Yes    PLAN: (Rx., injectx, surgery, frx, mri/ct) Recommend MRI lumbar spine.  Must rule out any neurologic compromise.  Patient will be a good candidate for therapy if NEG MRI  No orders of the defined types were placed in this encounter.   Arther Abbott, MD  07/13/2018 3:39 PM

## 2018-07-13 NOTE — Patient Instructions (Addendum)

## 2018-07-14 ENCOUNTER — Telehealth: Payer: Self-pay | Admitting: Radiology

## 2018-07-14 NOTE — Telephone Encounter (Signed)
Called to schedule MRI  Friday Oct 11th at 9am arrive 830 / made follow up visit with Dr Aline Brochure

## 2018-07-17 ENCOUNTER — Encounter (HOSPITAL_COMMUNITY): Payer: Self-pay

## 2018-07-17 ENCOUNTER — Ambulatory Visit (HOSPITAL_COMMUNITY)
Admission: RE | Admit: 2018-07-17 | Discharge: 2018-07-17 | Disposition: A | Discharge status: Home / Self Care | Payer: Self-pay | Source: Ambulatory Visit | Attending: Orthopedic Surgery | Admitting: Orthopedic Surgery

## 2018-07-17 DIAGNOSIS — M4856XA Collapsed vertebra, not elsewhere classified, lumbar region, initial encounter for fracture: Secondary | ICD-10-CM | POA: Insufficient documentation

## 2018-07-17 DIAGNOSIS — M79605 Pain in left leg: Secondary | ICD-10-CM

## 2018-07-17 DIAGNOSIS — M79604 Pain in right leg: Secondary | ICD-10-CM

## 2018-07-17 DIAGNOSIS — M545 Low back pain: Secondary | ICD-10-CM | POA: Insufficient documentation

## 2018-07-17 DIAGNOSIS — M5136 Other intervertebral disc degeneration, lumbar region: Secondary | ICD-10-CM | POA: Insufficient documentation

## 2018-07-24 ENCOUNTER — Encounter: Payer: Self-pay | Admitting: Orthopedic Surgery

## 2018-07-24 ENCOUNTER — Ambulatory Visit (INDEPENDENT_AMBULATORY_CARE_PROVIDER_SITE_OTHER): Payer: Self-pay | Admitting: Orthopedic Surgery

## 2018-07-24 DIAGNOSIS — M79604 Pain in right leg: Secondary | ICD-10-CM

## 2018-07-24 DIAGNOSIS — M79605 Pain in left leg: Secondary | ICD-10-CM

## 2018-07-24 DIAGNOSIS — M545 Low back pain: Secondary | ICD-10-CM

## 2018-07-24 NOTE — Progress Notes (Signed)
FOLLOW UP VISIT : MRI RESULTS   Chief Complaint  Patient presents with  . Back Pain    states she is in constant pain back buttocks legs     HPI: The patient is here TO DISCUSS THE RESULTS OF MRI  HPI 62 year old female chronic pain chronic back pain no treatment she had radicular symptoms we sent her for MRI ROS Her bowel and bladder function remain intact  Vitals 66 inch height 143 weight 143/72 BP and pulse 76  Image interpretation mild degenerative disc disease is all I see on this MRI MY READING: MRI OF THE   Disc levels:   T11-12 and T12-L1 are imaged in the sagittal plane only. The central canal and foramina are open at both levels with a very shallow central protrusion seen at T11-12.   L1-2: Negative.   L2-3: Negative.   L3-4: Minimal disc bulge and mild facet arthropathy.  No stenosis.   L4-5: There is an annular fissure and shallow disc bulge to the left. The central canal and foramina are open.   L5-S1: There is a shallow central protrusion without stenosis.   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   Mild, remote anterior superior endplate compression fracture L5.     Electronically Signed   By: Inge Rise M.D.   On: 07/17/2018 11:43    Result History   MR Lumbar Spine Wo Contrast (Order #[id number]) on 07/17/2018 - Order Result History Report  Encounter-Level Documents - 07/17/2018:   Electronic signature on 07/17/2018 8:31 AM - Signed  Scan on 07/17/2018 8:40 AM by Default, Provider, MDScan on 07/17/2018 8:40 AM by Default, Provider, MD       Order-Level Documents:   There are no order-level documents.  Hospital account-Level Documents:   There are no hospital account-level documents.  Orders Requiring a Screening Form   Procedure Order Status Form Status   MR Lumbar Spine Wo Contrast Completed Completed  Vitals   Height Weight BMI (Calculated)   5\' 2"  (1.575 m) 168 lb (76.2 kg) 30.72  Protocol Documents   Imaging Protocol  Imaging   Imaging Information  Resulted by:   Signed Date/Time  Phone Pager  Orlean Patten 07/17/2018 11:43 AM [phone]  Study Notes   Henrene Hawking, RT on 07/17/2018  8:41 AM  [place] [address] 1 year ago.  Has back pain buttock pain down both legs, left groin and right ischial pain. No recent trauma.  No hx CA.  No previous MRI.        Original Order   Ordered On Ordered By   07/13/2018  4:13 PM Littrell, Barnabas Harries, RT            External Result Report   External Result Report     Encounter Diagnosis  Name Primary?  . Lumbar pain with radiation down both legs Yes   PLAN: We will recommend physical therapy for her and follow-up with her referring physician I do not feel surgery is indicated at this time

## 2018-08-12 ENCOUNTER — Encounter (HOSPITAL_COMMUNITY): Payer: Self-pay

## 2018-08-12 ENCOUNTER — Other Ambulatory Visit: Payer: Self-pay

## 2018-08-12 ENCOUNTER — Ambulatory Visit (HOSPITAL_COMMUNITY): Payer: No Typology Code available for payment source | Attending: Orthopedic Surgery

## 2018-08-12 DIAGNOSIS — G8929 Other chronic pain: Secondary | ICD-10-CM

## 2018-08-12 DIAGNOSIS — M5441 Lumbago with sciatica, right side: Secondary | ICD-10-CM | POA: Insufficient documentation

## 2018-08-12 DIAGNOSIS — R29898 Other symptoms and signs involving the musculoskeletal system: Secondary | ICD-10-CM

## 2018-08-12 DIAGNOSIS — M5442 Lumbago with sciatica, left side: Secondary | ICD-10-CM | POA: Insufficient documentation

## 2018-08-12 DIAGNOSIS — M6281 Muscle weakness (generalized): Secondary | ICD-10-CM | POA: Insufficient documentation
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
## 2018-08-12 NOTE — Patient Instructions (Signed)
Lower Trunk Rotation Stretch    Keeping back flat and feet together, rotate knees to left side. Hold 10____ seconds. Repeat _10___ times to each side per set. Do _1___ sets per session. Do _1___ sessions per day.  http://orth.exer.us/123   Copyright  VHI. All rights reserved.   Knee to Chest    Lying supine, bend involved knee to chest 3___ times. Hold for 30 seconds. Repeat with other leg. Do _1__ times per day.  Copyright  VHI. All rights reserved.

## 2018-08-19 ENCOUNTER — Ambulatory Visit (HOSPITAL_COMMUNITY): Payer: No Typology Code available for payment source

## 2018-08-21 ENCOUNTER — Ambulatory Visit (HOSPITAL_COMMUNITY): Payer: No Typology Code available for payment source | Admitting: Physical Therapy

## 2018-08-21 ENCOUNTER — Telehealth (HOSPITAL_COMMUNITY): Payer: Self-pay | Admitting: Physical Therapy

## 2018-08-21 NOTE — Telephone Encounter (Signed)
Therapist called regarding patient not showing up for 11:15 appointment today. Patient stated she had called to cancel appointment. Therapist reminded patient of next scheduled appointment.   Clarene Critchley PT, DPT 2:48 PM, 08/21/18 [phone]

## 2018-08-25 ENCOUNTER — Telehealth (HOSPITAL_COMMUNITY): Payer: Self-pay | Admitting: Physician Assistant

## 2018-08-25 NOTE — Telephone Encounter (Signed)
08/25/18  pt called and asked that we cancel all future appts... no reason was given

## 2018-08-26 ENCOUNTER — Ambulatory Visit (HOSPITAL_COMMUNITY): Payer: No Typology Code available for payment source

## 2018-08-28 ENCOUNTER — Encounter (HOSPITAL_COMMUNITY): Payer: No Typology Code available for payment source | Admitting: Physical Therapy

## 2018-09-01 ENCOUNTER — Encounter (HOSPITAL_COMMUNITY): Payer: No Typology Code available for payment source

## 2018-09-09 ENCOUNTER — Encounter (HOSPITAL_COMMUNITY): Payer: No Typology Code available for payment source

## 2018-09-11 ENCOUNTER — Encounter (HOSPITAL_COMMUNITY): Payer: No Typology Code available for payment source

## 2018-09-16 ENCOUNTER — Encounter (HOSPITAL_COMMUNITY): Payer: No Typology Code available for payment source | Admitting: Physical Therapy

## 2018-09-18 ENCOUNTER — Encounter (HOSPITAL_COMMUNITY): Payer: No Typology Code available for payment source

## 2018-09-21 ENCOUNTER — Encounter (HOSPITAL_COMMUNITY): Payer: No Typology Code available for payment source

## 2018-09-23 ENCOUNTER — Encounter (HOSPITAL_COMMUNITY): Payer: No Typology Code available for payment source

## 2018-09-25 ENCOUNTER — Encounter (HOSPITAL_COMMUNITY): Payer: No Typology Code available for payment source

## 2018-09-29 ENCOUNTER — Encounter (HOSPITAL_COMMUNITY): Payer: No Typology Code available for payment source

## 2018-10-02 ENCOUNTER — Encounter (HOSPITAL_COMMUNITY): Payer: No Typology Code available for payment source

## 2018-10-04 ENCOUNTER — Other Ambulatory Visit: Payer: Self-pay

## 2018-10-04 ENCOUNTER — Emergency Department (HOSPITAL_COMMUNITY)
Admission: EM | Admit: 2018-10-04 | Discharge: 2018-10-04 | Disposition: A | Discharge status: Home / Self Care | Payer: No Typology Code available for payment source | Attending: Emergency Medicine | Admitting: Emergency Medicine

## 2018-10-04 ENCOUNTER — Emergency Department (HOSPITAL_COMMUNITY): Payer: No Typology Code available for payment source

## 2018-10-04 ENCOUNTER — Encounter (HOSPITAL_COMMUNITY): Payer: Self-pay | Admitting: Emergency Medicine

## 2018-10-04 DIAGNOSIS — Z79899 Other long term (current) drug therapy: Secondary | ICD-10-CM | POA: Insufficient documentation

## 2018-10-04 DIAGNOSIS — I10 Essential (primary) hypertension: Secondary | ICD-10-CM | POA: Insufficient documentation

## 2018-10-04 DIAGNOSIS — J45909 Unspecified asthma, uncomplicated: Secondary | ICD-10-CM | POA: Insufficient documentation

## 2018-10-04 DIAGNOSIS — R0789 Other chest pain: Secondary | ICD-10-CM

## 2018-10-04 DIAGNOSIS — Z7982 Long term (current) use of aspirin: Secondary | ICD-10-CM | POA: Insufficient documentation

## 2018-10-04 LAB — CBC
HEMATOCRIT: 40.7 % (ref 36.0–46.0)
HEMOGLOBIN: 12.6 g/dL (ref 12.0–15.0)
MCH: 27.7 pg (ref 26.0–34.0)
MCHC: 31 g/dL (ref 30.0–36.0)
MCV: 89.5 fL (ref 80.0–100.0)
Platelets: 274 10*3/uL (ref 150–400)
RBC: 4.55 MIL/uL (ref 3.87–5.11)
RDW: 13.1 % (ref 11.5–15.5)
WBC: 6 10*3/uL (ref 4.0–10.5)
nRBC: 0 % (ref 0.0–0.2)

## 2018-10-04 LAB — BASIC METABOLIC PANEL
Anion gap: 7 (ref 5–15)
BUN: 24 mg/dL — ABNORMAL HIGH (ref 8–23)
CHLORIDE: 110 mmol/L (ref 98–111)
CO2: 23 mmol/L (ref 22–32)
Calcium: 9.2 mg/dL (ref 8.9–10.3)
Creatinine, Ser: 1.4 mg/dL — ABNORMAL HIGH (ref 0.44–1.00)
GFR calc Af Amer: 46 mL/min — ABNORMAL LOW (ref >60)
GFR calc non Af Amer: 40 mL/min — ABNORMAL LOW (ref >60)
GLUCOSE: 103 mg/dL — AB (ref 70–99)
POTASSIUM: 3.9 mmol/L (ref 3.5–5.1)
SODIUM: 140 mmol/L (ref 135–145)

## 2018-10-04 LAB — TROPONIN I: Troponin I: <0.03 ng/mL (ref <0.03)

## 2018-10-04 MED ORDER — PREDNISONE 20 MG PO TABS: 20.0000 mg | ORAL_TABLET | Freq: Two times a day (BID) | ORAL | 0 refills | Status: DC

## 2018-10-04 MED ORDER — PREDNISONE 50 MG PO TABS
60.0000 mg | ORAL_TABLET | Freq: Once | ORAL | Status: DC
  Filled 2018-10-04: qty 1

## 2018-10-04 NOTE — ED Provider Notes (Signed)
[facility] EMERGENCY DEPARTMENT Provider Note   CSN: [id number] Arrival date & time: 10/04/18  1344     History   Chief Complaint Chief Complaint  Patient presents with  . Chest Pain    HPI Kendra Jones is a 62 y.o. female.  HPI  She presents for evaluation of chest pain.  Chest pain is beneath her right breast, present for 1 and half weeks, constantly, without known etiology.  She denies trauma.  She denies fever, chills, cough, shortness of breath, nausea, vomiting, abdominal pain, back pain, dizziness, dysuria or change in bowel habits.  She has never had this previously.  There are no other known modifying factors.  Past Medical History:  Diagnosis Date  . Anxiety   . Asthma   . Back injury    fracture in 1980 & 2007  . GERD (gastroesophageal reflux disease)   . Heart murmur   . Hypertension   . Sciatica     Patient Active Problem List   Diagnosis Date Noted  . Hyperlipidemia 04/06/2018  . Essential hypertension 03/10/2018    Past Surgical History:  Procedure Laterality Date  . ABDOMINAL HYSTERECTOMY    . BREAST BIOPSY Right   . CATARACT EXTRACTION, BILATERAL  2016  . CESAREAN SECTION  12-26-1988, 04-22-1991   x2  . TONSILLECTOMY Bilateral age 29     OB History   No obstetric history on file.      Home Medications    Prior to Admission medications   Medication Sig Start Date End Date Taking? Authorizing Provider  albuterol (PROVENTIL HFA;VENTOLIN HFA) 108 (90 Base) MCG/ACT inhaler Inhale 2 puffs into the lungs every 6 (six) hours as needed for wheezing or shortness of breath. Patient not taking: Reported on 07/13/2018 03/11/18   Soyla Dryer, PA-C  albuterol (PROVENTIL) (2.5 MG/3ML) 0.083% nebulizer solution Take 3 mLs (2.5 mg total) by nebulization every 6 (six) hours as needed for wheezing or shortness of breath. Patient not taking: Reported on 07/13/2018 03/10/18   Soyla Dryer, PA-C  amLODipine (NORVASC) 10 MG tablet Take 1 tablet (10 mg  total) by mouth daily. 06/23/18   Soyla Dryer, PA-C  aspirin EC 81 MG tablet Take 81 mg by mouth daily.    [provider]  atenolol (TENORMIN) 25 MG tablet Take 1 tablet (25 mg total) by mouth daily. 06/23/18   Soyla Dryer, PA-C  predniSONE (DELTASONE) 20 MG tablet Take 1 tablet (20 mg total) by mouth 2 (two) times daily. 10/04/18   Daleen Bo, MD  triamcinolone cream (KENALOG) 0.1 % Apply 1 application topically 2 (two) times daily. Patient not taking: Reported on 04/21/2018 03/26/18   Soyla Dryer, PA-C    Family History Family History  Problem Relation Age of Onset  . CAD Other   . Diabetes Mother   . Anemia Mother   . Heart disease Mother   . Kidney disease Father   . Heart disease Father   . Hyperlipidemia Father   . Hypertension Sister   . Hypertension Brother   . Heart attack Maternal Grandmother   . Cancer Maternal Grandfather   . Aneurysm Paternal Grandmother   . Cancer Paternal Grandfather        stomach  . Hypertension Brother   . Hypertension Brother   . Hypertension Brother     Social History Social History   Tobacco Use  . Smoking status: Never Smoker  . Smokeless tobacco: Never Used  Substance Use Topics  . Alcohol use: Never  Frequency: Never  . Drug use: Never     Allergies   Sulfa antibiotics   Review of Systems Review of Systems  All other systems reviewed and are negative.    Physical Exam Updated Vital Signs BP 136/63   Pulse (!) 56   Temp 98.3 F (36.8 C) (Oral)   Resp 16   Ht 5\' 2"  (1.575 m)   Wt 74.8 kg   SpO2 97%   BMI 30.18 kg/m   Physical Exam Vitals signs and nursing note reviewed.  Constitutional:      Appearance: She is well-developed. She is not ill-appearing or diaphoretic.  HENT:     Head: Normocephalic and atraumatic.     Right Ear: External ear normal.     Left Ear: External ear normal.  Eyes:     Conjunctiva/sclera: Conjunctivae normal.     Pupils: Pupils are equal, round, and  reactive to light.  Neck:     Musculoskeletal: Normal range of motion and neck supple.     Trachea: Phonation normal.  Cardiovascular:     Rate and Rhythm: Normal rate and regular rhythm.     Heart sounds: Normal heart sounds.  Pulmonary:     Effort: Pulmonary effort is normal.     Breath sounds: Normal breath sounds.  Chest:     Chest wall: Tenderness (Right lower anterior chest wall tenderness.  Point of maximal tenderness is at the costochondral/sternal junction..  There is no associated swelling or deformity at this site.  There is no associated crepitation.) present.  Abdominal:     Palpations: Abdomen is soft.     Tenderness: There is no abdominal tenderness.  Musculoskeletal: Normal range of motion.  Skin:    General: Skin is warm and dry.  Neurological:     Mental Status: She is alert and oriented to person, place, and time.     Cranial Nerves: No cranial nerve deficit.     Sensory: No sensory deficit.     Motor: No abnormal muscle tone.     Coordination: Coordination normal.  Psychiatric:        Behavior: Behavior normal.        Thought Content: Thought content normal.        Judgment: Judgment normal.      ED Treatments / Results  Labs (all labs ordered are listed, but only abnormal results are displayed) Labs Reviewed  BASIC METABOLIC PANEL - Abnormal; Notable for the following components:      Result Value   Glucose, Bld 103 (*)    BUN 24 (*)    Creatinine, Ser 1.40 (*)    GFR calc non Af Amer 40 (*)    GFR calc Af Amer 46 (*)    All other components within normal limits  CBC  TROPONIN I    EKG None  Radiology Dg Chest 2 View  Result Date: 10/04/2018 CLINICAL DATA:  Chest pain. EXAM: CHEST - 2 VIEW COMPARISON:  Radiograph of September 15, 2017. FINDINGS: The heart size and mediastinal contours are within normal limits. Both lungs are clear. No pneumothorax or pleural effusion is noted. The visualized skeletal structures are unremarkable. IMPRESSION:  No active cardiopulmonary disease. Electronically Signed   By: Marijo Conception, M.D.   On: 10/04/2018 14:47    Procedures Procedures (including critical care time)  Medications Ordered in ED Medications  predniSONE (DELTASONE) tablet 60 mg (has no administration in time range)     Initial Impression / Assessment and Plan /  ED Course  I have reviewed the triage vital signs and the nursing notes.  Pertinent labs & imaging results that were available during my care of the patient were reviewed by me and considered in my medical decision making (see chart for details).      Patient Vitals for the past 24 hrs:  BP Temp Temp src Pulse Resp SpO2 Height Weight  10/04/18 1800 136/63 - - (!) 56 16 97 % - -  10/04/18 1739 (!) 143/61 98.3 F (36.8 C) Oral 60 18 99 % - -  10/04/18 1415 126/71 98.1 F (36.7 C) Oral 65 18 99 % 5\' 2"  (1.575 m) 74.8 kg    6:17 PM Reevaluation with update and discussion. After initial assessment and treatment, an updated evaluation reveals no change in clinical status.  Findings discussed with patient all questions answered.Daleen Bo   Medical Decision Making: Chest pain, reproducible, with constant pain, for 10 days, and negative ED evaluation.  Doubt ACS, PE or pneumonia.  Suspect musculoskeletal pain.  CRITICAL CARE-no Performed by: Daleen Bo   Nursing Notes Reviewed/ Care Coordinated Applicable Imaging Reviewed Interpretation of Laboratory Data incorporated into ED treatment  The patient appears reasonably screened and/or stabilized for discharge and I doubt any other medical condition or other [facility] requiring further screening, evaluation, or treatment in the ED at this time prior to discharge.  Plan: Home Medications-ibuprofen Tylenol as needed, start ibuprofen after prednisone done; Home Treatments-rest, heat to affected area; return here if the recommended treatment, does not improve the symptoms; Recommended follow up-PCP checkup 1 week and  as needed   Final Clinical Impressions(s) / ED Diagnoses   Final diagnoses:  Chest wall pain    ED Discharge Orders         Ordered    predniSONE (DELTASONE) 20 MG tablet  2 times daily     10/04/18 1831           Daleen Bo, MD 10/04/18 [phone]

## 2018-10-04 NOTE — Discharge Instructions (Signed)
The discomfort of your chest appears to be from inflammation near the sternum and rib junction.  Use heat on the sore area 3 or 4 times a day for 40 minutes.  We sent a prescription for prednisone to your pharmacy.  Rest as much as possible.  Use Tylenol if needed for pain.  After the prednisone is done you can use ibuprofen for pain.  Return here, if needed, for problems.

## 2018-10-04 NOTE — ED Triage Notes (Signed)
PT STATES THAT SHE IS HAVING CHEST PAINS THAT STARTED OVER A WEEK AGO IT HURTING INTO HER BACK UNDER HER SHOULDER BLADE.

## 2018-10-12 ENCOUNTER — Encounter: Payer: Self-pay | Admitting: Physician Assistant

## 2018-10-12 ENCOUNTER — Ambulatory Visit: Payer: Medicaid Other | Admitting: Physician Assistant

## 2018-10-12 VITALS — BP 125/64 | HR 69 | Temp 97.9°F | Ht 61.75 in | Wt 165.0 lb

## 2018-10-12 DIAGNOSIS — I10 Essential (primary) hypertension: Secondary | ICD-10-CM

## 2018-10-12 DIAGNOSIS — N189 Chronic kidney disease, unspecified: Secondary | ICD-10-CM

## 2018-10-12 DIAGNOSIS — R0789 Other chest pain: Secondary | ICD-10-CM

## 2018-10-12 NOTE — Progress Notes (Signed)
BP 125/64 (BP Location: Right Arm, Patient Position: Sitting, Cuff Size: Normal)   Pulse 69   Temp 97.9 F (36.6 C) (Other (Comment))   Ht 5' 1.75" (1.568 m)   Wt 165 lb (74.8 kg)   SpO2 98%   BMI 30.42 kg/m    Subjective:    Patient ID: Kendra Jones, female    DOB: Jan 30, 1956, 63 y.o.   MRN: [id number]  HPI: Kendra Jones is a 63 y.o. female presenting on 10/12/2018 for Breast Pain (c/o right breast pain for 3 weeks, degree of pain depends on movement,"hurts to cough and sneeze")   HPI   Chief Complaint  Patient presents with  . Breast Pain    c/o right breast pain for 3 weeks, degree of pain depends on movement,"hurts to cough and sneeze"   she went to ER on 12/29 for same breast pain.  She was given prednisone.   She says pain no better.   Pain is constant.  Pt says pain increases with ROM RUE or if she bends/twists and causes movement to the R chest wall  Pt is a never smoker.   Reviewed notes from 12/29 ER visit including labs and imaging  Pt very happy that BP is so good.  She says she went for years and years with it being uncontrolled prior to coming to the [facility].   Relevant past medical, surgical, family and social history reviewed and updated as indicated. Interim medical history since our last visit reviewed. Allergies and medications reviewed and updated.   Current Outpatient Medications:  .  albuterol (PROVENTIL HFA;VENTOLIN HFA) 108 (90 Base) MCG/ACT inhaler, Inhale 2 puffs into the lungs every 6 (six) hours as needed for wheezing or shortness of breath., Disp: 1 Inhaler, Rfl: 0 .  albuterol (PROVENTIL) (2.5 MG/3ML) 0.083% nebulizer solution, Take 3 mLs (2.5 mg total) by nebulization every 6 (six) hours as needed for wheezing or shortness of breath., Disp: 25 vial, Rfl: 1 .  amLODipine (NORVASC) 10 MG tablet, Take 1 tablet (10 mg total) by mouth daily., Disp: 90 tablet, Rfl: 1 .  aspirin EC 81 MG tablet, Take 81 mg by mouth daily., Disp: , Rfl:  .   atenolol (TENORMIN) 25 MG tablet, Take 1 tablet (25 mg total) by mouth daily., Disp: 90 tablet, Rfl: 1 .  triamcinolone cream (KENALOG) 0.1 %, Apply 1 application topically 2 (two) times daily. (Patient not taking: Reported on 04/21/2018), Disp: 30 g, Rfl: 0   Review of Systems  Constitutional: Negative for appetite change, chills, diaphoresis, fatigue, fever and unexpected weight change.  HENT: Negative for congestion, dental problem, drooling, ear pain, facial swelling, hearing loss, mouth sores, sneezing, sore throat, trouble swallowing and voice change.   Eyes: Negative for pain, discharge, redness, itching and visual disturbance.  Respiratory: Negative for cough, choking, shortness of breath and wheezing.   Cardiovascular: Negative for chest pain, palpitations and leg swelling.  Gastrointestinal: Negative for abdominal pain, blood in stool, constipation, diarrhea and vomiting.  Endocrine: Negative for cold intolerance, heat intolerance and polydipsia.  Genitourinary: Negative for decreased urine volume, dysuria and hematuria.  Musculoskeletal: Positive for back pain. Negative for arthralgias and gait problem.  Skin: Negative for rash.  Allergic/Immunologic: Negative for environmental allergies.  Neurological: Negative for seizures, syncope, light-headedness and headaches.  Hematological: Negative for adenopathy.  Psychiatric/Behavioral: Positive for decreased concentration. Negative for agitation, dysphoric mood and suicidal ideas. The patient is not nervous/anxious.     Per HPI unless specifically indicated above  Objective:    BP 125/64 (BP Location: Right Arm, Patient Position: Sitting, Cuff Size: Normal)   Pulse 69   Temp 97.9 F (36.6 C) (Other (Comment))   Ht 5' 1.75" (1.568 m)   Wt 165 lb (74.8 kg)   SpO2 98%   BMI 30.42 kg/m   Wt Readings from Last 3 Encounters:  10/12/18 165 lb (74.8 kg)  10/04/18 165 lb (74.8 kg)  07/13/18 168 lb (76.2 kg)    Physical  Exam Vitals signs reviewed.  Constitutional:      Appearance: She is well-developed.  HENT:     Head: Normocephalic and atraumatic.  Neck:     Musculoskeletal: Neck supple.  Cardiovascular:     Rate and Rhythm: Normal rate and regular rhythm.  Pulmonary:     Effort: Pulmonary effort is normal.     Breath sounds: Normal breath sounds.  Chest:     Chest wall: Tenderness present. No mass, deformity, swelling or crepitus.       Comments: R chest wall tenderness. Abdominal:     General: Bowel sounds are normal.     Palpations: Abdomen is soft. There is no mass.     Tenderness: There is no abdominal tenderness.  Lymphadenopathy:     Cervical: No cervical adenopathy.  Skin:    General: Skin is warm and dry.  Neurological:     Mental Status: She is alert and oriented to person, place, and time.  Psychiatric:        Behavior: Behavior normal.     Results for orders placed or performed during the hospital encounter of 54/27/06  Basic metabolic panel  Result Value Ref Range   Sodium 140 135 - 145 mmol/L   Potassium 3.9 3.5 - 5.1 mmol/L   Chloride 110 98 - 111 mmol/L   CO2 23 22 - 32 mmol/L   Glucose, Bld 103 (H) 70 - 99 mg/dL   BUN 24 (H) 8 - 23 mg/dL   Creatinine, Ser 1.40 (H) 0.44 - 1.00 mg/dL   Calcium 9.2 8.9 - 10.3 mg/dL   GFR calc non Af Amer 40 (L) >60 mL/min   GFR calc Af Amer 46 (L) >60 mL/min   Anion gap 7 5 - 15  CBC  Result Value Ref Range   WBC 6.0 4.0 - 10.5 K/uL   RBC 4.55 3.87 - 5.11 MIL/uL   Hemoglobin 12.6 12.0 - 15.0 g/dL   HCT 40.7 36.0 - 46.0 %   MCV 89.5 80.0 - 100.0 fL   MCH 27.7 26.0 - 34.0 pg   MCHC 31.0 30.0 - 36.0 g/dL   RDW 13.1 11.5 - 15.5 %   Platelets 274 150 - 400 K/uL   nRBC 0.0 0.0 - 0.2 %  Troponin I - ONCE - STAT  Result Value Ref Range   Troponin I <0.03 <0.03 ng/mL      Assessment & Plan:    Encounter Diagnoses  Name Primary?  . Chest wall pain Yes  . Essential hypertension   . Chronic kidney disease, unspecified CKD  stage     -discussed MS etiology.  Discussed repeating CXR but pt declines -bp good.  Pt to continue current medication -will monitor renal function -pt to F/u 3 months.  RTO sooner prn worsening or new symptoms or SOB

## 2018-10-21 ENCOUNTER — Ambulatory Visit: Payer: Medicaid Other | Admitting: Physician Assistant

## 2019-01-20 ENCOUNTER — Ambulatory Visit: Payer: Medicaid Other | Admitting: Physician Assistant

## 2019-02-18 ENCOUNTER — Ambulatory Visit: Payer: Medicaid Other | Admitting: Physician Assistant

## 2019-02-18 ENCOUNTER — Encounter: Payer: Self-pay | Admitting: Physician Assistant

## 2019-02-18 DIAGNOSIS — I10 Essential (primary) hypertension: Secondary | ICD-10-CM

## 2019-02-18 MED ORDER — AMLODIPINE BESYLATE 10 MG PO TABS: 10.0000 mg | ORAL_TABLET | Freq: Every day | ORAL | 1 refills | Status: DC

## 2019-02-18 MED ORDER — ATENOLOL 25 MG PO TABS: 25.0000 mg | ORAL_TABLET | Freq: Every day | ORAL | 1 refills | Status: DC

## 2019-02-18 NOTE — Progress Notes (Signed)
   There were no vitals taken for this visit.   Subjective:    Patient ID: Kendra Jones, female    DOB: [birth year], 63 y.o.   MRN: [id number]  HPI: Kendra Jones is a 63 y.o. female presenting on 02/18/2019 for No chief complaint on file.   HPI  This is a telemedicine appointment due to coronavirus pandemic.  It is via Telephone as pt does not have a smart phone  I connected with  Kendra Jones on 02/18/19 by a video enabled telemedicine application and verified that I am speaking with the correct person using two identifiers.   I discussed the limitations of evaluation and management by telemedicine. The patient expressed understanding and agreed to proceed.   Pt says she is doing well.  Her granddaughter is living with her now.  Pt is staying Active and and is exercising regularly. She wears a mask when she goes out to the store.  She has no complaints today.     Relevant past medical, surgical, family and social history reviewed and updated as indicated. Interim medical history since our last visit reviewed. Allergies and medications reviewed and updated.   Current Outpatient Medications:  .  albuterol (PROVENTIL HFA;VENTOLIN HFA) 108 (90 Base) MCG/ACT inhaler, Inhale 2 puffs into the lungs every 6 (six) hours as needed for wheezing or shortness of breath., Disp: 1 Inhaler, Rfl: 0 .  albuterol (PROVENTIL) (2.5 MG/3ML) 0.083% nebulizer solution, Take 3 mLs (2.5 mg total) by nebulization every 6 (six) hours as needed for wheezing or shortness of breath., Disp: 25 vial, Rfl: 1 .  amLODipine (NORVASC) 10 MG tablet, Take 1 tablet (10 mg total) by mouth daily., Disp: 90 tablet, Rfl: 1 .  aspirin EC 81 MG tablet, Take 81 mg by mouth daily., Disp: , Rfl:  .  atenolol (TENORMIN) 25 MG tablet, Take 1 tablet (25 mg total) by mouth daily., Disp: 90 tablet, Rfl: 1   Review of Systems  Per HPI unless specifically indicated above     Objective:    There were no vitals taken for this visit.   Wt Readings from Last 3 Encounters:  10/12/18 165 lb (74.8 kg)  10/04/18 165 lb (74.8 kg)  07/13/18 168 lb (76.2 kg)    Physical Exam Pulmonary:     Effort: Pulmonary effort is normal. No respiratory distress.  Neurological:     Mental Status: She is alert and oriented to person, place, and time.  Psychiatric:        Attention and Perception: Attention normal.        Mood and Affect: Mood normal.        Speech: Speech normal.        Behavior: Behavior is cooperative.         Assessment & Plan:   Encounter Diagnosis  Name Primary?  . Essential hypertension Yes     -pt to Continue current rx -encouraged pt to continue social distancing and wearing a mask at the store -No labs now.  Will update labs before follow up OV in 3 month.  Pt to contact office sooner prn

## 2019-05-24 ENCOUNTER — Other Ambulatory Visit: Payer: Self-pay | Admitting: Physician Assistant

## 2019-05-24 DIAGNOSIS — E785 Hyperlipidemia, unspecified: Secondary | ICD-10-CM

## 2019-05-24 DIAGNOSIS — N189 Chronic kidney disease, unspecified: Secondary | ICD-10-CM

## 2019-05-24 DIAGNOSIS — I10 Essential (primary) hypertension: Secondary | ICD-10-CM

## 2019-05-31 ENCOUNTER — Encounter: Payer: Self-pay | Admitting: Physician Assistant

## 2019-05-31 ENCOUNTER — Ambulatory Visit: Payer: Medicaid Other | Admitting: Physician Assistant

## 2019-05-31 ENCOUNTER — Other Ambulatory Visit: Payer: Self-pay | Admitting: Physician Assistant

## 2019-05-31 VITALS — BP 138/78

## 2019-05-31 DIAGNOSIS — I10 Essential (primary) hypertension: Secondary | ICD-10-CM

## 2019-05-31 DIAGNOSIS — Z1239 Encounter for other screening for malignant neoplasm of breast: Secondary | ICD-10-CM

## 2019-05-31 NOTE — Progress Notes (Signed)
   BP 138/78    Subjective:    Patient ID: Kendra Jones, female    DOB: 08/31/1956, 63 y.o.   MRN: [id number]  HPI: Kendra Jones is a 63 y.o. female presenting on 05/31/2019 for Hypertension   HPI   This is a telemedicine visit due to coronavirus pandemic.  It is via telephone as pt does not have smartphone with video capabilities  I connected with  Kendra Jones on 05/31/19 by a video enabled telemedicine application and verified that I am speaking with the correct person using two identifiers.   I discussed the limitations of evaluation and management by telemedicine. The patient expressed understanding and agreed to proceed.  Pt is out of state visiting someone/she is in [place].  Provider is at office.   She monitors her bp at home.  Today it was 138/78 Which is typical of her readings.  .  She is feeling well.  She is doen to 153 pound.  She is walking regularly and watching what she eats.    She is out of state. That is  Why she didn't get her labs drawn.  She will be back Wednesday.  She has no complaints today.     Relevant past medical, surgical, family and social history reviewed and updated as indicated. Interim medical history since our last visit reviewed. Allergies and medications reviewed and updated.   Current Outpatient Medications:  .  albuterol (PROVENTIL HFA;VENTOLIN HFA) 108 (90 Base) MCG/ACT inhaler, Inhale 2 puffs into the lungs every 6 (six) hours as needed for wheezing or shortness of breath., Disp: 1 Inhaler, Rfl: 0 .  albuterol (PROVENTIL) (2.5 MG/3ML) 0.083% nebulizer solution, Take 3 mLs (2.5 mg total) by nebulization every 6 (six) hours as needed for wheezing or shortness of breath., Disp: 25 vial, Rfl: 1 .  amLODipine (NORVASC) 10 MG tablet, Take 1 tablet (10 mg total) by mouth daily., Disp: 90 tablet, Rfl: 1 .  aspirin EC 81 MG tablet, Take 81 mg by mouth daily., Disp: , Rfl:  .  atenolol (TENORMIN) 25 MG tablet, Take 1 tablet (25 mg total)  by mouth daily., Disp: 90 tablet, Rfl: 1   Review of Systems  Per HPI unless specifically indicated above     Objective:    BP 138/78   Wt Readings from Last 3 Encounters:  10/12/18 165 lb (74.8 kg)  10/04/18 165 lb (74.8 kg)  07/13/18 168 lb (76.2 kg)    Physical Exam Pulmonary:     Effort: No respiratory distress.  Neurological:     Mental Status: She is alert and oriented to person, place, and time.  Psychiatric:        Attention and Perception: Attention normal.        Speech: Speech normal.        Behavior: Behavior is cooperative.           Assessment & Plan:    Encounter Diagnoses  Name Primary?  . Essential hypertension Yes  . Screening for breast cancer       -will refer for Screening mammogram -pt to Get her fafasting labs done later this week when she returns to town. She will be called with results -pt to Continue to monitor bp at home -pt encouraged to Continue healthy diet and regular exercise -she is to follow up in 3 months.  She is to contact office sooner prn.

## 2019-06-16 ENCOUNTER — Encounter (HOSPITAL_COMMUNITY): Payer: No Typology Code available for payment source

## 2019-07-16 ENCOUNTER — Ambulatory Visit (HOSPITAL_COMMUNITY): Payer: No Typology Code available for payment source

## 2019-07-20 ENCOUNTER — Other Ambulatory Visit (HOSPITAL_COMMUNITY): Payer: Self-pay | Admitting: Physician Assistant

## 2019-07-20 DIAGNOSIS — Z1231 Encounter for screening mammogram for malignant neoplasm of breast: Secondary | ICD-10-CM

## 2019-08-09 ENCOUNTER — Ambulatory Visit (HOSPITAL_COMMUNITY): Payer: No Typology Code available for payment source

## 2019-08-19 ENCOUNTER — Other Ambulatory Visit: Payer: Self-pay

## 2019-08-19 ENCOUNTER — Other Ambulatory Visit (HOSPITAL_COMMUNITY)
Admission: RE | Admit: 2019-08-19 | Discharge: 2019-08-19 | Disposition: A | Discharge status: Home / Self Care | Payer: No Typology Code available for payment source | Source: Ambulatory Visit | Attending: Physician Assistant | Admitting: Physician Assistant

## 2019-08-19 ENCOUNTER — Ambulatory Visit (HOSPITAL_COMMUNITY)
Admission: RE | Admit: 2019-08-19 | Discharge: 2019-08-19 | Disposition: A | Discharge status: Home / Self Care | Payer: No Typology Code available for payment source | Source: Ambulatory Visit | Attending: Physician Assistant | Admitting: Physician Assistant

## 2019-08-19 DIAGNOSIS — N189 Chronic kidney disease, unspecified: Secondary | ICD-10-CM | POA: Insufficient documentation

## 2019-08-19 DIAGNOSIS — E785 Hyperlipidemia, unspecified: Secondary | ICD-10-CM

## 2019-08-19 DIAGNOSIS — R69 Illness, unspecified: Secondary | ICD-10-CM | POA: Insufficient documentation

## 2019-08-19 DIAGNOSIS — I10 Essential (primary) hypertension: Secondary | ICD-10-CM

## 2019-08-19 DIAGNOSIS — Z1231 Encounter for screening mammogram for malignant neoplasm of breast: Secondary | ICD-10-CM

## 2019-08-19 LAB — COMPREHENSIVE METABOLIC PANEL
ALT: 24 U/L (ref 0–44)
AST: 21 U/L (ref 15–41)
Albumin: 4.4 g/dL (ref 3.5–5.0)
Alkaline Phosphatase: 80 U/L (ref 38–126)
Anion gap: 8 (ref 5–15)
BUN: 19 mg/dL (ref 8–23)
CO2: 26 mmol/L (ref 22–32)
Calcium: 9.5 mg/dL (ref 8.9–10.3)
Chloride: 108 mmol/L (ref 98–111)
Creatinine, Ser: 1.44 mg/dL — ABNORMAL HIGH (ref 0.44–1.00)
GFR calc Af Amer: 44 mL/min — ABNORMAL LOW (ref >60)
GFR calc non Af Amer: 38 mL/min — ABNORMAL LOW (ref >60)
Glucose, Bld: 100 mg/dL — ABNORMAL HIGH (ref 70–99)
Potassium: 4.2 mmol/L (ref 3.5–5.1)
Sodium: 142 mmol/L (ref 135–145)
Total Bilirubin: 0.7 mg/dL (ref 0.3–1.2)
Total Protein: 7.5 g/dL (ref 6.5–8.1)

## 2019-08-19 LAB — LIPID PANEL
Cholesterol: 231 mg/dL — ABNORMAL HIGH (ref 0–200)
HDL: 57 mg/dL (ref >40)
LDL Cholesterol: 148 mg/dL — ABNORMAL HIGH (ref 0–99)
Total CHOL/HDL Ratio: 4.1 RATIO
Triglycerides: 131 mg/dL (ref <150)
VLDL: 26 mg/dL (ref 0–40)

## 2019-08-23 ENCOUNTER — Encounter: Payer: Self-pay | Admitting: Physician Assistant

## 2019-08-23 ENCOUNTER — Ambulatory Visit: Payer: Medicaid Other | Admitting: Physician Assistant

## 2019-08-23 VITALS — BP 140/76 | HR 76 | Temp 97.6°F | Wt 150.0 lb

## 2019-08-23 DIAGNOSIS — N189 Chronic kidney disease, unspecified: Secondary | ICD-10-CM

## 2019-08-23 DIAGNOSIS — E785 Hyperlipidemia, unspecified: Secondary | ICD-10-CM

## 2019-08-23 DIAGNOSIS — I10 Essential (primary) hypertension: Secondary | ICD-10-CM

## 2019-08-23 DIAGNOSIS — J4520 Mild intermittent asthma, uncomplicated: Secondary | ICD-10-CM

## 2019-08-23 MED ORDER — LISINOPRIL 10 MG PO TABS: 10.0000 mg | ORAL_TABLET | Freq: Every day | ORAL | 4 refills | Status: DC

## 2019-08-23 NOTE — Patient Instructions (Signed)

## 2019-08-23 NOTE — Progress Notes (Signed)
BP 140/76   Pulse 76   Temp 97.6 F (36.4 C)   Wt 150 lb (68 kg)   BMI 27.66 kg/m    Subjective:    Patient ID: Kendra Jones, female    DOB: Jul 14, 1956, 63 y.o.   MRN: [id number]  HPI: Kendra Jones is a 63 y.o. female presenting on 08/23/2019 for No chief complaint on file.   HPI   This is a telemedicine appointment due to coronavirus pandemic.  It is via telephone as patient does not have a video enabled device.  I connected with  Kendra Jones on 08/23/19 by a video enabled telemedicine application and verified that I am speaking with the correct person using two identifiers.   I discussed the limitations of evaluation and management by telemedicine. The patient expressed understanding and agreed to proceed.   Patient is at home.  Provider is working from home office.   Patient is a 63 year old female with history of hypertension, dyslipidemia, asthma.  Patient states she now has her 71-year-old granddaughter living with her and she has plans to be adopting this child in the near future.  Patient says it is not what she really wants but that she wants to make sure the child is not in the system any longer.  Patient says she is doing well.  She has been checking her blood pressures at home regularly.  She thinks her blood pressure is a little bit high today because she has not taken her medicine yet today.  She says her blood pressure usually runs around 132/76.  Patient is feeling well and denies any shortness of breath, chest pain, abdominal pain.  She says that she has not been needing her inhaler for many months now.  Patient says she is wearing a mask when she goes out in public now per CDC guidelines to reduce risk of COVID-19.  Relevant past medical, surgical, family and social history reviewed and updated as indicated. Interim medical history since our last visit reviewed. Allergies and medications reviewed and updated.   Current Outpatient Medications:  .  amLODipine  (NORVASC) 10 MG tablet, Take 1 tablet (10 mg total) by mouth daily., Disp: 90 tablet, Rfl: 1 .  aspirin EC 81 MG tablet, Take 81 mg by mouth daily., Disp: , Rfl:  .  atenolol (TENORMIN) 25 MG tablet, Take 1 tablet (25 mg total) by mouth daily., Disp: 90 tablet, Rfl: 1 .  albuterol (PROVENTIL HFA;VENTOLIN HFA) 108 (90 Base) MCG/ACT inhaler, Inhale 2 puffs into the lungs every 6 (six) hours as needed for wheezing or shortness of breath. (Patient not taking: Reported on 08/23/2019), Disp: 1 Inhaler, Rfl: 0 .  albuterol (PROVENTIL) (2.5 MG/3ML) 0.083% nebulizer solution, Take 3 mLs (2.5 mg total) by nebulization every 6 (six) hours as needed for wheezing or shortness of breath. (Patient not taking: Reported on 08/23/2019), Disp: 25 vial, Rfl: 1   Review of Systems  Per HPI unless specifically indicated above     Objective:    BP 140/76   Pulse 76   Temp 97.6 F (36.4 C)   Wt 150 lb (68 kg)   BMI 27.66 kg/m   Wt Readings from Last 3 Encounters:  08/23/19 150 lb (68 kg)  10/12/18 165 lb (74.8 kg)  10/04/18 165 lb (74.8 kg)    Physical Exam Pulmonary:     Effort: No respiratory distress.  Neurological:     Mental Status: She is alert and oriented to person, place, and time.  Psychiatric:        Attention and Perception: Attention normal.        Mood and Affect: Mood normal.        Speech: Speech normal.        Behavior: Behavior is cooperative.     Results for orders placed or performed during the hospital encounter of 08/19/19  Lipid panel  Result Value Ref Range   Cholesterol 231 (H) 0 - 200 mg/dL   Triglycerides 131 <150 mg/dL   HDL 57 >40 mg/dL   Total CHOL/HDL Ratio 4.1 RATIO   VLDL 26 0 - 40 mg/dL   LDL Cholesterol 148 (H) 0 - 99 mg/dL  Comprehensive metabolic panel  Result Value Ref Range   Sodium 142 135 - 145 mmol/L   Potassium 4.2 3.5 - 5.1 mmol/L   Chloride 108 98 - 111 mmol/L   CO2 26 22 - 32 mmol/L   Glucose, Bld 100 (H) 70 - 99 mg/dL   BUN 19 8 - 23 mg/dL    Creatinine, Ser 1.44 (H) 0.44 - 1.00 mg/dL   Calcium 9.5 8.9 - 10.3 mg/dL   Total Protein 7.5 6.5 - 8.1 g/dL   Albumin 4.4 3.5 - 5.0 g/dL   AST 21 15 - 41 U/L   ALT 24 0 - 44 U/L   Alkaline Phosphatase 80 38 - 126 U/L   Total Bilirubin 0.7 0.3 - 1.2 mg/dL   GFR calc non Af Amer 38 (L) >60 mL/min   GFR calc Af Amer 44 (L) >60 mL/min   Anion gap 8 5 - 15      Assessment & Plan:    1.  Hypertension.  Reviewed labs with patient.  Patient will continue amlodipine and atenolol.  We will add lisinopril 10 mg.  Patient is to continue to monitor the blood pressure at home.  Patient will follow up in 3 months for recheck.  2 dyslipidemia  Discussed starting statin medication to help the lipids.  Patient says she really does not want to do that at this time.  Discussed that and she would like to try low-fat diet for 3 months and have it rechecked at that time.  Patient is mailed low-fat diet information.  Also discussed with patient that regular exercise can also help to lower the cholesterol.  3.  CKD  We will continue to Monitor renal function.  Discussed referral to nephrology with the patient and that she would need referral if her renal function diminishes any further.  Discussed that addition of lisinopril should hopefully help with her renal function.  4.  Asthma  Patient has inhaler to use as needed.  5.  Healthcare maintenance  Mammogram is up-to-date Patient is status post hysterectomy Patient is due for colon cancer screening in the near future.  Her last iFOBT was in July 2019.  Patient does start on Medicare on January 06, 2020 and would likely be her best option just to wait until colonoscopy at that time.    Patient is to contact office before her 99-month follow-up if needed for any new symptoms.  She is encouraged to continue wearing a mask when in public.

## 2019-09-09 ENCOUNTER — Other Ambulatory Visit: Payer: Self-pay | Admitting: Physician Assistant

## 2019-09-09 ENCOUNTER — Telehealth: Payer: Self-pay | Admitting: Student

## 2019-09-09 MED ORDER — ATENOLOL 50 MG PO TABS: 50.0000 mg | ORAL_TABLET | Freq: Every day | ORAL | 3 refills | Status: AC

## 2019-09-09 NOTE — Progress Notes (Unsigned)
aten

## 2019-09-09 NOTE — Telephone Encounter (Signed)
Pt called c/o having problems with her lisinopril making her cough and nauseous.   PA added lisinopril at last appt on 08-23-19.  PA advises for pt to d/c lisinopril and to increase atenolol from 25 mg to 50 mg. LPN notified pt and pt agrees and prefers rx to be sent to [facility] in [place].  Pt is to continue to monitor her bp at home and to f/u Feb 2021 as scheduled unless she has high BP readings. Pt verbalized understanding.

## 2019-09-20 ENCOUNTER — Ambulatory Visit: Payer: Medicaid Other | Admitting: Physician Assistant

## 2019-09-20 ENCOUNTER — Encounter: Payer: Self-pay | Admitting: Physician Assistant

## 2019-09-20 VITALS — BP 132/72

## 2019-09-20 DIAGNOSIS — R05 Cough: Secondary | ICD-10-CM

## 2019-09-20 DIAGNOSIS — J4521 Mild intermittent asthma with (acute) exacerbation: Secondary | ICD-10-CM

## 2019-09-20 DIAGNOSIS — R059 Cough, unspecified: Secondary | ICD-10-CM

## 2019-09-20 DIAGNOSIS — Z20822 Contact with and (suspected) exposure to covid-19: Secondary | ICD-10-CM

## 2019-09-20 MED ORDER — PREDNISONE 20 MG PO TABS: 20.0000 mg | ORAL_TABLET | Freq: Two times a day (BID) | ORAL | 0 refills | Status: DC

## 2019-09-20 NOTE — Progress Notes (Signed)
BP 132/72    Subjective:    Patient ID: Kendra Jones, female    DOB: 01/10/1956, 63 y.o.   MRN: [id number]  HPI: Doshie Maggi is a 63 y.o. female presenting on 09/20/2019 for No chief complaint on file.   HPI   This is a telemedicine appointment due to coronavirus pandemic.  It is via Telephone as pt does not have a video enabled device.    I connected with  Kendra Jones on 09/20/19 by a video enabled telemedicine application and verified that I am speaking with the correct person using two identifiers.   I discussed the limitations of evaluation and management by telemedicine. The patient expressed understanding and agreed to proceed.  Pt is at home.  Provider is at office.     Pt c/o Cough x 2 wk.  No fever.  She is Using nebs.   She doesn't work.  Her Only other housemate is 87yo granddaughter.    The child is not currently going to school.    She didn't go anywhere for thanksgiving.  No known exposures to covid 19.    Relevant past medical, surgical, family and social history reviewed and updated as indicated. Interim medical history since our last visit reviewed. Allergies and medications reviewed and updated.   Current Outpatient Medications:  .  albuterol (PROVENTIL HFA;VENTOLIN HFA) 108 (90 Base) MCG/ACT inhaler, Inhale 2 puffs into the lungs every 6 (six) hours as needed for wheezing or shortness of breath., Disp: 1 Inhaler, Rfl: 0 .  albuterol (PROVENTIL) (2.5 MG/3ML) 0.083% nebulizer solution, Take 3 mLs (2.5 mg total) by nebulization every 6 (six) hours as needed for wheezing or shortness of breath., Disp: 25 vial, Rfl: 1 .  amLODipine (NORVASC) 10 MG tablet, Take 1 tablet (10 mg total) by mouth daily., Disp: 90 tablet, Rfl: 1 .  aspirin EC 81 MG tablet, Take 81 mg by mouth daily., Disp: , Rfl:  .  atenolol (TENORMIN) 50 MG tablet, Take 1 tablet (50 mg total) by mouth daily., Disp: 30 tablet, Rfl: 3        Review of Systems  Per HPI unless specifically  indicated above     Objective:    BP 132/72   Wt Readings from Last 3 Encounters:  08/23/19 150 lb (68 kg)  10/12/18 165 lb (74.8 kg)  10/04/18 165 lb (74.8 kg)    Physical Exam Pulmonary:     Effort: No respiratory distress.  Neurological:     Mental Status: She is alert and oriented to person, place, and time.  Psychiatric:        Attention and Perception: Attention normal.        Speech: Speech normal.        Behavior: Behavior is cooperative.           Assessment & Plan:   Encounter Diagnoses  Name Primary?  . Cough Yes  . Suspected COVID-19 virus infection   . Intermittent asthma with acute exacerbation, unspecified asthma severity      -Pt referred for covid test.  She is instructed to text COVID to [phone] to schedule appointment for testing (she does not have a computer to sign up online).  She is counseled to call our office if she has any problems with scheduling.   -counseled pt on self-isolation -rx Prednisone- [facility].  She has plenty of nebulizers to use as needed -pt to to F/u february as scheduled.  She is to contact office sooner if needed  for worsening, new symptoms or other problems

## 2019-09-23 ENCOUNTER — Other Ambulatory Visit: Payer: Self-pay | Admitting: Physician Assistant

## 2019-09-27 ENCOUNTER — Telehealth: Payer: Self-pay | Admitting: Physician Assistant

## 2019-09-27 NOTE — Telephone Encounter (Signed)
Pt seen for cough 09/20/19 and was given rx prednisone.  She says got tested at [facility] and was negative for covid.  She says she is still coughing with having some phlegm.   No fevers.  No sob.  She is using the nebulizer and inhaler which help.   She hasn't tried any OTCs.   She is encouraged to try OTC mucinex and plenty of water, rest and fluids.  She is to contact office if she persists or worsens.

## 2019-11-16 ENCOUNTER — Other Ambulatory Visit (HOSPITAL_COMMUNITY)
Admission: RE | Admit: 2019-11-16 | Discharge: 2019-11-16 | Disposition: A | Discharge status: Home / Self Care | Payer: No Typology Code available for payment source | Source: Ambulatory Visit | Attending: Physician Assistant | Admitting: Physician Assistant

## 2019-11-16 DIAGNOSIS — I10 Essential (primary) hypertension: Secondary | ICD-10-CM

## 2019-11-16 DIAGNOSIS — E785 Hyperlipidemia, unspecified: Secondary | ICD-10-CM | POA: Insufficient documentation

## 2019-11-16 DIAGNOSIS — N189 Chronic kidney disease, unspecified: Secondary | ICD-10-CM | POA: Insufficient documentation

## 2019-11-16 LAB — COMPREHENSIVE METABOLIC PANEL
ALT: 20 U/L (ref 0–44)
AST: 18 U/L (ref 15–41)
Albumin: 4 g/dL (ref 3.5–5.0)
Alkaline Phosphatase: 65 U/L (ref 38–126)
Anion gap: 9 (ref 5–15)
BUN: 22 mg/dL (ref 8–23)
CO2: 25 mmol/L (ref 22–32)
Calcium: 8.9 mg/dL (ref 8.9–10.3)
Chloride: 107 mmol/L (ref 98–111)
Creatinine, Ser: 1.42 mg/dL — ABNORMAL HIGH (ref 0.44–1.00)
GFR calc Af Amer: 45 mL/min — ABNORMAL LOW (ref >60)
GFR calc non Af Amer: 39 mL/min — ABNORMAL LOW (ref >60)
Glucose, Bld: 102 mg/dL — ABNORMAL HIGH (ref 70–99)
Potassium: 4.4 mmol/L (ref 3.5–5.1)
Sodium: 141 mmol/L (ref 135–145)
Total Bilirubin: 0.8 mg/dL (ref 0.3–1.2)
Total Protein: 6.8 g/dL (ref 6.5–8.1)

## 2019-11-16 LAB — LIPID PANEL
Cholesterol: 185 mg/dL (ref 0–200)
HDL: 54 mg/dL (ref >40)
LDL Cholesterol: 113 mg/dL — ABNORMAL HIGH (ref 0–99)
Total CHOL/HDL Ratio: 3.4 RATIO
Triglycerides: 92 mg/dL (ref <150)
VLDL: 18 mg/dL (ref 0–40)

## 2019-11-22 ENCOUNTER — Other Ambulatory Visit: Payer: Self-pay

## 2019-11-22 ENCOUNTER — Ambulatory Visit: Payer: Medicaid Other | Admitting: Physician Assistant

## 2019-11-22 ENCOUNTER — Encounter: Payer: Self-pay | Admitting: Physician Assistant

## 2019-11-22 VITALS — BP 120/68 | HR 60 | Temp 98.1°F | Wt 154.4 lb

## 2019-11-22 DIAGNOSIS — E785 Hyperlipidemia, unspecified: Secondary | ICD-10-CM

## 2019-11-22 DIAGNOSIS — N189 Chronic kidney disease, unspecified: Secondary | ICD-10-CM

## 2019-11-22 DIAGNOSIS — J4520 Mild intermittent asthma, uncomplicated: Secondary | ICD-10-CM

## 2019-11-22 DIAGNOSIS — I10 Essential (primary) hypertension: Secondary | ICD-10-CM

## 2019-11-22 NOTE — Progress Notes (Signed)
BP 120/68   Pulse 60   Temp 98.1 F (36.7 C)   Wt 154 lb 6.4 oz (70 kg)   SpO2 97%   BMI 28.47 kg/m    Subjective:    Patient ID: Kendra Jones, female    DOB: 10/29/1955, 64 y.o.   MRN: [id number]  HPI: Kendra Jones is a 64 y.o. female presenting on 11/22/2019 for Hypertension, Hyperlipidemia, and Chronic Kidney Disease   HPI  Pt had a negative covid 19 screening questionnaire    Pt is 21yoF with HTN and CKD.   Pt gets medicare in less than 2 months.  She says her Breathing has been okay lately     Relevant past medical, surgical, family and social history reviewed and updated as indicated. Interim medical history since our last visit reviewed. Allergies and medications reviewed and updated.   Current Outpatient Medications:  .  albuterol (PROVENTIL HFA;VENTOLIN HFA) 108 (90 Base) MCG/ACT inhaler, Inhale 2 puffs into the lungs every 6 (six) hours as needed for wheezing or shortness of breath., Disp: 1 Inhaler, Rfl: 0 .  albuterol (PROVENTIL) (2.5 MG/3ML) 0.083% nebulizer solution, Take 3 mLs (2.5 mg total) by nebulization every 6 (six) hours as needed for wheezing or shortness of breath., Disp: 25 vial, Rfl: 1 .  amLODipine (NORVASC) 10 MG tablet, Take 1 tablet by mouth once daily, Disp: 90 tablet, Rfl: 0 .  aspirin EC 81 MG tablet, Take 81 mg by mouth daily., Disp: , Rfl:  .  atenolol (TENORMIN) 50 MG tablet, Take 1 tablet (50 mg total) by mouth daily., Disp: 30 tablet, Rfl: 3    Review of Systems  Per HPI unless specifically indicated above     Objective:    BP 120/68   Pulse 60   Temp 98.1 F (36.7 C)   Wt 154 lb 6.4 oz (70 kg)   SpO2 97%   BMI 28.47 kg/m   Wt Readings from Last 3 Encounters:  11/22/19 154 lb 6.4 oz (70 kg)  08/23/19 150 lb (68 kg)  10/12/18 165 lb (74.8 kg)    Physical Exam Vitals reviewed.  Constitutional:      General: She is not in acute distress.    Appearance: Normal appearance. She is well-developed. She is not  ill-appearing.  HENT:     Head: Normocephalic and atraumatic.  Cardiovascular:     Rate and Rhythm: Normal rate and regular rhythm.  Pulmonary:     Effort: Pulmonary effort is normal.     Breath sounds: Normal breath sounds.  Abdominal:     General: Bowel sounds are normal.     Palpations: Abdomen is soft. There is no mass.     Tenderness: There is no abdominal tenderness.  Musculoskeletal:     Cervical back: Neck supple.     Right lower leg: No edema.     Left lower leg: No edema.  Lymphadenopathy:     Cervical: No cervical adenopathy.  Skin:    General: Skin is warm and dry.  Neurological:     Mental Status: She is alert and oriented to person, place, and time.  Psychiatric:        Attention and Perception: Attention normal.        Speech: Speech normal.        Behavior: Behavior normal. Behavior is cooperative.     Results for orders placed or performed during the hospital encounter of 11/16/19  Lipid panel  Result Value Ref Range   Cholesterol  185 0 - 200 mg/dL   Triglycerides 92 <150 mg/dL   HDL 54 >40 mg/dL   Total CHOL/HDL Ratio 3.4 RATIO   VLDL 18 0 - 40 mg/dL   LDL Cholesterol 113 (H) 0 - 99 mg/dL  Comprehensive metabolic panel  Result Value Ref Range   Sodium 141 135 - 145 mmol/L   Potassium 4.4 3.5 - 5.1 mmol/L   Chloride 107 98 - 111 mmol/L   CO2 25 22 - 32 mmol/L   Glucose, Bld 102 (H) 70 - 99 mg/dL   BUN 22 8 - 23 mg/dL   Creatinine, Ser 1.42 (H) 0.44 - 1.00 mg/dL   Calcium 8.9 8.9 - 10.3 mg/dL   Total Protein 6.8 6.5 - 8.1 g/dL   Albumin 4.0 3.5 - 5.0 g/dL   AST 18 15 - 41 U/L   ALT 20 0 - 44 U/L   Alkaline Phosphatase 65 38 - 126 U/L   Total Bilirubin 0.8 0.3 - 1.2 mg/dL   GFR calc non Af Amer 39 (L) >60 mL/min   GFR calc Af Amer 45 (L) >60 mL/min   Anion gap 9 5 - 15      Assessment & Plan:    Encounter Diagnoses  Name Primary?  . Essential hypertension Yes  . Hyperlipidemia, unspecified hyperlipidemia type   . Chronic kidney disease,  unspecified CKD stage   . Intermittent asthma without complication, unspecified asthma severity      -Reviewed labs with pt.   -Pt to continue her meds -encouraged lowfat diet for her lipids -discussed with pt that she needs to start looking for new PCP and get scheduled for new patient appointment with her new provider.  She will not be scheduled to return here as she gets medicare in about 6 weeks.  She is to contact office if she has a need prior to 4/1

## 2020-01-02 ENCOUNTER — Other Ambulatory Visit: Payer: Self-pay | Admitting: Physician Assistant

## 2020-01-13 DIAGNOSIS — R69 Illness, unspecified: Secondary | ICD-10-CM | POA: Diagnosis not present

## 2020-02-09 ENCOUNTER — Other Ambulatory Visit: Payer: Self-pay | Admitting: Physician Assistant

## 2020-02-11 ENCOUNTER — Other Ambulatory Visit: Payer: Self-pay | Admitting: Physician Assistant

## 2020-02-18 DIAGNOSIS — Z882 Allergy status to sulfonamides status: Secondary | ICD-10-CM | POA: Diagnosis not present

## 2020-02-18 DIAGNOSIS — I10 Essential (primary) hypertension: Secondary | ICD-10-CM | POA: Diagnosis not present

## 2020-02-18 DIAGNOSIS — Z833 Family history of diabetes mellitus: Secondary | ICD-10-CM | POA: Diagnosis not present

## 2020-02-18 DIAGNOSIS — Z0000 Encounter for general adult medical examination without abnormal findings: Secondary | ICD-10-CM | POA: Diagnosis not present

## 2020-02-24 DIAGNOSIS — J45909 Unspecified asthma, uncomplicated: Secondary | ICD-10-CM | POA: Diagnosis not present

## 2020-02-24 DIAGNOSIS — J45901 Unspecified asthma with (acute) exacerbation: Secondary | ICD-10-CM | POA: Diagnosis not present

## 2020-02-24 DIAGNOSIS — J309 Allergic rhinitis, unspecified: Secondary | ICD-10-CM | POA: Diagnosis not present

## 2020-03-09 DIAGNOSIS — J4531 Mild persistent asthma with (acute) exacerbation: Secondary | ICD-10-CM | POA: Diagnosis not present

## 2020-03-09 DIAGNOSIS — E875 Hyperkalemia: Secondary | ICD-10-CM | POA: Diagnosis not present

## 2020-03-30 DIAGNOSIS — N189 Chronic kidney disease, unspecified: Secondary | ICD-10-CM | POA: Diagnosis not present

## 2020-03-30 DIAGNOSIS — N1832 Chronic kidney disease, stage 3b: Secondary | ICD-10-CM | POA: Diagnosis not present

## 2020-06-29 DIAGNOSIS — M25541 Pain in joints of right hand: Secondary | ICD-10-CM | POA: Diagnosis not present

## 2020-06-29 DIAGNOSIS — M19042 Primary osteoarthritis, left hand: Secondary | ICD-10-CM | POA: Diagnosis not present

## 2020-06-29 DIAGNOSIS — M79641 Pain in right hand: Secondary | ICD-10-CM | POA: Diagnosis not present

## 2020-06-29 DIAGNOSIS — M25542 Pain in joints of left hand: Secondary | ICD-10-CM | POA: Diagnosis not present

## 2020-06-29 DIAGNOSIS — M79642 Pain in left hand: Secondary | ICD-10-CM | POA: Diagnosis not present

## 2020-06-29 DIAGNOSIS — M19041 Primary osteoarthritis, right hand: Secondary | ICD-10-CM | POA: Diagnosis not present

## 2020-07-03 DIAGNOSIS — M533 Sacrococcygeal disorders, not elsewhere classified: Secondary | ICD-10-CM | POA: Diagnosis not present

## 2020-07-03 DIAGNOSIS — M79671 Pain in right foot: Secondary | ICD-10-CM | POA: Diagnosis not present

## 2020-07-03 DIAGNOSIS — M25761 Osteophyte, right knee: Secondary | ICD-10-CM | POA: Diagnosis not present

## 2020-07-03 DIAGNOSIS — M25762 Osteophyte, left knee: Secondary | ICD-10-CM | POA: Diagnosis not present

## 2020-07-03 DIAGNOSIS — M25561 Pain in right knee: Secondary | ICD-10-CM | POA: Diagnosis not present

## 2020-07-03 DIAGNOSIS — M79643 Pain in unspecified hand: Secondary | ICD-10-CM | POA: Diagnosis not present

## 2020-07-03 DIAGNOSIS — M1711 Unilateral primary osteoarthritis, right knee: Secondary | ICD-10-CM | POA: Diagnosis not present

## 2020-07-03 DIAGNOSIS — M6530 Trigger finger, unspecified finger: Secondary | ICD-10-CM | POA: Diagnosis not present

## 2020-07-03 DIAGNOSIS — M25562 Pain in left knee: Secondary | ICD-10-CM | POA: Diagnosis not present

## 2020-07-03 DIAGNOSIS — M2550 Pain in unspecified joint: Secondary | ICD-10-CM | POA: Diagnosis not present

## 2020-07-03 DIAGNOSIS — M7989 Other specified soft tissue disorders: Secondary | ICD-10-CM | POA: Diagnosis not present

## 2020-07-03 DIAGNOSIS — M19072 Primary osteoarthritis, left ankle and foot: Secondary | ICD-10-CM | POA: Diagnosis not present

## 2020-07-03 DIAGNOSIS — M549 Dorsalgia, unspecified: Secondary | ICD-10-CM | POA: Diagnosis not present

## 2020-07-03 DIAGNOSIS — M19071 Primary osteoarthritis, right ankle and foot: Secondary | ICD-10-CM | POA: Diagnosis not present

## 2020-07-03 DIAGNOSIS — M1990 Unspecified osteoarthritis, unspecified site: Secondary | ICD-10-CM | POA: Diagnosis not present

## 2020-07-03 DIAGNOSIS — M1712 Unilateral primary osteoarthritis, left knee: Secondary | ICD-10-CM | POA: Diagnosis not present

## 2020-07-03 DIAGNOSIS — N1831 Chronic kidney disease, stage 3a: Secondary | ICD-10-CM | POA: Diagnosis not present

## 2020-07-03 DIAGNOSIS — M79672 Pain in left foot: Secondary | ICD-10-CM | POA: Diagnosis not present

## 2020-07-03 DIAGNOSIS — M545 Low back pain: Secondary | ICD-10-CM | POA: Diagnosis not present

## 2020-08-24 DIAGNOSIS — R059 Cough, unspecified: Secondary | ICD-10-CM | POA: Diagnosis not present

## 2020-08-24 DIAGNOSIS — R0981 Nasal congestion: Secondary | ICD-10-CM | POA: Diagnosis not present

## 2020-08-24 DIAGNOSIS — R053 Chronic cough: Secondary | ICD-10-CM | POA: Diagnosis not present

## 2020-10-10 DIAGNOSIS — J45901 Unspecified asthma with (acute) exacerbation: Secondary | ICD-10-CM | POA: Diagnosis not present

## 2020-10-11 DIAGNOSIS — M1990 Unspecified osteoarthritis, unspecified site: Secondary | ICD-10-CM | POA: Diagnosis not present

## 2020-10-11 DIAGNOSIS — N1831 Chronic kidney disease, stage 3a: Secondary | ICD-10-CM | POA: Diagnosis not present

## 2020-10-11 DIAGNOSIS — M7989 Other specified soft tissue disorders: Secondary | ICD-10-CM | POA: Diagnosis not present

## 2020-10-11 DIAGNOSIS — M79643 Pain in unspecified hand: Secondary | ICD-10-CM | POA: Diagnosis not present

## 2020-10-11 DIAGNOSIS — Z79899 Other long term (current) drug therapy: Secondary | ICD-10-CM | POA: Diagnosis not present

## 2020-11-04 DIAGNOSIS — I129 Hypertensive chronic kidney disease with stage 1 through stage 4 chronic kidney disease, or unspecified chronic kidney disease: Secondary | ICD-10-CM | POA: Diagnosis not present

## 2020-11-04 DIAGNOSIS — M1990 Unspecified osteoarthritis, unspecified site: Secondary | ICD-10-CM | POA: Diagnosis not present

## 2020-11-04 DIAGNOSIS — N1831 Chronic kidney disease, stage 3a: Secondary | ICD-10-CM | POA: Diagnosis not present

## 2020-12-04 DIAGNOSIS — N183 Chronic kidney disease, stage 3 unspecified: Secondary | ICD-10-CM | POA: Diagnosis not present

## 2020-12-04 DIAGNOSIS — M1990 Unspecified osteoarthritis, unspecified site: Secondary | ICD-10-CM | POA: Diagnosis not present

## 2020-12-04 DIAGNOSIS — I129 Hypertensive chronic kidney disease with stage 1 through stage 4 chronic kidney disease, or unspecified chronic kidney disease: Secondary | ICD-10-CM | POA: Diagnosis not present

## 2020-12-11 DIAGNOSIS — N1831 Chronic kidney disease, stage 3a: Secondary | ICD-10-CM | POA: Diagnosis not present

## 2020-12-11 DIAGNOSIS — M0609 Rheumatoid arthritis without rheumatoid factor, multiple sites: Secondary | ICD-10-CM | POA: Diagnosis not present

## 2020-12-11 DIAGNOSIS — M7989 Other specified soft tissue disorders: Secondary | ICD-10-CM | POA: Diagnosis not present

## 2020-12-11 DIAGNOSIS — M79643 Pain in unspecified hand: Secondary | ICD-10-CM | POA: Diagnosis not present

## 2020-12-11 DIAGNOSIS — Z79899 Other long term (current) drug therapy: Secondary | ICD-10-CM | POA: Diagnosis not present

## 2020-12-11 DIAGNOSIS — M1990 Unspecified osteoarthritis, unspecified site: Secondary | ICD-10-CM | POA: Diagnosis not present

## 2021-01-04 DIAGNOSIS — I129 Hypertensive chronic kidney disease with stage 1 through stage 4 chronic kidney disease, or unspecified chronic kidney disease: Secondary | ICD-10-CM | POA: Diagnosis not present

## 2021-01-04 DIAGNOSIS — M1990 Unspecified osteoarthritis, unspecified site: Secondary | ICD-10-CM | POA: Diagnosis not present

## 2021-01-04 DIAGNOSIS — N1831 Chronic kidney disease, stage 3a: Secondary | ICD-10-CM | POA: Diagnosis not present

## 2021-01-17 DIAGNOSIS — M25552 Pain in left hip: Secondary | ICD-10-CM | POA: Diagnosis not present

## 2021-01-17 DIAGNOSIS — M79645 Pain in left finger(s): Secondary | ICD-10-CM | POA: Diagnosis not present

## 2021-01-17 DIAGNOSIS — Z043 Encounter for examination and observation following other accident: Secondary | ICD-10-CM | POA: Diagnosis not present

## 2021-01-17 DIAGNOSIS — M545 Low back pain, unspecified: Secondary | ICD-10-CM | POA: Diagnosis not present

## 2021-01-17 DIAGNOSIS — K625 Hemorrhage of anus and rectum: Secondary | ICD-10-CM | POA: Diagnosis not present

## 2021-01-17 LAB — BASIC METABOLIC PANEL
BUN: 22 — AB (ref 4–21)
CO2: 29 — AB (ref 13–22)
Chloride: 106 (ref 99–108)
Creatinine: 1.7 — AB (ref 0.5–1.1)
Glucose: 103
Potassium: 4.3 (ref 3.4–5.3)
Sodium: 142 (ref 137–147)

## 2021-01-17 LAB — COMPREHENSIVE METABOLIC PANEL
Albumin: 4 (ref 3.5–5.0)
Calcium: 9 (ref 8.7–10.7)
GFR calc Af Amer: 38.57
GFR calc non Af Amer: 31.88
Globulin: 3.3

## 2021-01-17 LAB — HEPATIC FUNCTION PANEL
ALT: 37 — AB (ref 7–35)
AST: 21 (ref 13–35)
Alkaline Phosphatase: 85 (ref 25–125)
Bilirubin, Total: 0.5

## 2021-01-17 LAB — CBC AND DIFFERENTIAL
HCT: 37 (ref 36–46)
Hemoglobin: 12.4 (ref 12.0–16.0)
Platelets: 305 (ref 150–399)
WBC: 9.1

## 2021-01-17 LAB — CBC: RBC: 4.28 (ref 3.87–5.11)

## 2021-01-26 ENCOUNTER — Encounter: Payer: Self-pay | Admitting: Nurse Practitioner

## 2021-02-03 DIAGNOSIS — I129 Hypertensive chronic kidney disease with stage 1 through stage 4 chronic kidney disease, or unspecified chronic kidney disease: Secondary | ICD-10-CM | POA: Diagnosis not present

## 2021-02-03 DIAGNOSIS — N1831 Chronic kidney disease, stage 3a: Secondary | ICD-10-CM | POA: Diagnosis not present

## 2021-02-03 DIAGNOSIS — M1990 Unspecified osteoarthritis, unspecified site: Secondary | ICD-10-CM | POA: Diagnosis not present

## 2021-02-09 DIAGNOSIS — M79645 Pain in left finger(s): Secondary | ICD-10-CM | POA: Diagnosis not present

## 2021-02-13 DIAGNOSIS — Z833 Family history of diabetes mellitus: Secondary | ICD-10-CM | POA: Insufficient documentation

## 2021-02-13 DIAGNOSIS — M069 Rheumatoid arthritis, unspecified: Secondary | ICD-10-CM | POA: Insufficient documentation

## 2021-02-13 DIAGNOSIS — Z2821 Immunization not carried out because of patient refusal: Secondary | ICD-10-CM | POA: Insufficient documentation

## 2021-02-13 DIAGNOSIS — J4531 Mild persistent asthma with (acute) exacerbation: Secondary | ICD-10-CM | POA: Insufficient documentation

## 2021-02-13 DIAGNOSIS — N1831 Chronic kidney disease, stage 3a: Secondary | ICD-10-CM | POA: Insufficient documentation

## 2021-02-13 DIAGNOSIS — J309 Allergic rhinitis, unspecified: Secondary | ICD-10-CM | POA: Insufficient documentation

## 2021-02-13 NOTE — Progress Notes (Signed)
02/13/2021 Kendra Jones [id number] 1956/05/10   CHIEF COMPLAINT: Rectal bleeding  HISTORY OF PRESENT ILLNESS: Kendra Jones is a 65 year old female with a past medical history of anxiety, asthma, hypertension and lower back pain. She presents to our office today as referred by Dr. Shelia Media for further evaluation for rectal bleeding. She fell and down 4 brick stairs which resulted in breaking her left thumb and injuring her left hip on 01/15/2021. A few days later, she went to the bathroom and passed a normal formed brown bowel movement without straining with a large amount of bright red blood.  No associated anorectal pain.  No abdominal pain.  She had 3 similar episodes of rectal bleeding, the last 2 episodes occurred on 4/29 and 4/30.  No further rectal bleeding since then.  She takes aspirin 81 mg daily.  No other NSAIDs.  She denies ever having a screening colonoscopy.  She complains of having possible acid reflux.  She complains of throat burning which occurs for few days and goes away for few days then recurs for the past year.  She has some odynophagia with worsening throat burning as she swallows at times.  She also describes feeling as if food is stuck to her mid esophagus which occurs once weekly and last for 2 to 3 minutes.  It is unclear if food is actually stuck.  She is able to drink water without difficulty.  During these episodes, she stands up and reaches her hands upright to alleviate her throat and esophageal discomfort.  No lower abdominal pain.  She is passing a normal formed brown bowel movement most days.  No rectal bleeding or black stools.  She reports having prolonged  sedation with from anesthesia used during her hysterectomy surgery.  No known family history of colorectal cancer.  Maternal and paternal grandfathers with history of stomach cancer.  She has a history of asthma which she reports is well controlled.  She has a history of a heart murmur.  She underwent an echo 10/2017  which showed LVEF 60 -65%, trivial mitral regurgitation and moderately sclerotic to mildly stenotic aortic valve.  She denies having any chest pain, palpitations or shortness of breath.  She is not followed by cardiology.  ECHO 10/08/2017: Normal LV wall thickness with LVEF 65-70% and a grade 1 diastolic  dysfunction. Mild left atrial enlargement. Mildly calcified  mitral annulus with trivial mitral regurgitation. Moderately  sclerotic to mildly stenotic aortic valve. Trivial tricuspid  regurgitation with estimated PASP 21 mmHg.    CBC Latest Ref Rng & Units 01/17/2021 10/04/2018  WBC - 9.1 6.0  Hemoglobin 12.0 - 16.0 12.4 12.6  Hematocrit 36 - 46 37 40.7  Platelets 150 - 399 305 274   CMP Latest Ref Rng & Units 01/17/2021 11/16/2019 08/19/2019  Glucose 70 - 99 mg/dL - 102(H) 100(H)  BUN 4 - 21 22(A) 22 19  Creatinine 0.5 - 1.1 1.7(A) 1.42(H) 1.44(H)  Sodium 137 - 147 142 141 142  Potassium 3.4 - 5.3 4.3 4.4 4.2  Chloride 99 - 108 106 107 108  CO2 13 - 22 29(A) 25 26  Calcium 8.7 - 10.7 9.0 8.9 9.5  Total Protein 6.5 - 8.1 g/dL - 6.8 7.5  Total Bilirubin 0.3 - 1.2 mg/dL - 0.8 0.7  Alkaline Phos 25 - 125 85 65 80  AST 13 - 35 21 18 21   ALT 7 - 35 37(A) 20 24   ECHO 10/08/2017:  - Left ventricle: The cavity  size was normal. Wall thickness was  normal. Systolic function was vigorous. The estimated ejection  fraction was in the range of 65% to 70%. Wall motion was normal;  there were no regional wall motion abnormalities. Doppler  parameters are consistent with abnormal left ventricular  relaxation (grade 1 diastolic dysfunction).  - Aortic valve: Mildly calcified annulus. Trileaflet. Moderately  sclerotic to mildly stenotic valve. Peak gradient (S): 24 mm Hg.  Valve area (VTI): 1.43 cm^2.  - Mitral valve: Mildly calcified annulus. There was trivial  regurgitation.  - Left atrium: The atrium was mildly dilated.  - Right atrium: Central venous pressure (est): 3 mm Hg.   - Atrial septum: No defect or patent foramen ovale was identified.  - Tricuspid valve: There was trivial regurgitation.  - Pulmonary arteries: PA peak pressure: 21 mm Hg (S).  - Pericardium, extracardiac: There was no pericardial effusion.   Impressions:   - Normal LV wall thickness with LVEF 65-70% and a grade 1 diastolic  dysfunction. Mild left atrial enlargement. Mildly calcified  mitral annulus with trivial mitral regurgitation. Moderately  sclerotic to mildly stenotic aortic valve. Trivial tricuspid  regurgitation with estimated PASP 21 mmHg.    Past Medical History:  Diagnosis Date  . Anxiety   . Asthma   . Back injury    fracture in 1980 & 2007  . GERD (gastroesophageal reflux disease)   . Heart murmur   . Hypertension   . Sciatica    Past Surgical History:  Procedure Laterality Date  . ABDOMINAL HYSTERECTOMY    . BREAST BIOPSY Right   . CATARACT EXTRACTION, BILATERAL  2016  . CESAREAN SECTION  12-26-1988, 04-22-1991   x2  . TONSILLECTOMY Bilateral age 60   Social History: She is widowed.  She has 1 son and 1 daughter.  She is retired.  Non-smoker.  No alcohol use.  No drug use.  Family History: Mother age 52 with history of heart disease and diabetes. Father deceased 18 heart and kidney disease.  Maternal and paternal grandfathers with history of stomach cancer.  Maternal grandmother with MI.   Allergies  Allergen Reactions  . Sulfa Antibiotics Rash      Outpatient Encounter Medications as of 02/14/2021  Medication Sig  . albuterol (PROVENTIL HFA;VENTOLIN HFA) 108 (90 Base) MCG/ACT inhaler Inhale 2 puffs into the lungs every 6 (six) hours as needed for wheezing or shortness of breath.  Marland Kitchen albuterol (PROVENTIL) (2.5 MG/3ML) 0.083% nebulizer solution Take 3 mLs (2.5 mg total) by nebulization every 6 (six) hours as needed for wheezing or shortness of breath.  Marland Kitchen amLODipine (NORVASC) 10 MG tablet Take 1 tablet by mouth once daily  . aspirin EC 81 MG tablet  Take 81 mg by mouth daily.  Marland Kitchen atenolol (TENORMIN) 50 MG tablet Take 1 tablet (50 mg total) by mouth daily.   No facility-administered encounter medications on file as of 02/14/2021.     REVIEW OF SYSTEMS:  Gen: + Fatigue. Denies fever, sweats or chills. No weight loss.  CV: Denies chest pain, palpitations or edema. Resp: + cough. No SOB.  GI: See HPI. GU : Denies urinary burning, blood in urine, increased urinary frequency or incontinence. MS: Denies joint pain, muscles aches or weakness. Derm: Denies rash, itchiness, skin lesions or unhealing ulcers. Psych: Denies depression, anxiety, memory loss, suicidal ideation and confusion. Heme: Denies bruising, bleeding. Neuro:  Denies headaches, dizziness or paresthesias. Endo:  Denies any problems with DM, thyroid or adrenal function.  PHYSICAL EXAM: BP 112/60 (  BP Location: Left Arm, Patient Position: Sitting, Cuff Size: Normal)   Pulse 72   Ht 5' 1.75" (1.568 m) Comment: height measured without shoes  Wt 169 lb 4 oz (76.8 kg)   BMI 31.21 kg/m   General: 65 year old female in no acute distress. Head: Normocephalic and atraumatic. Eyes:  Sclerae non-icteric, conjunctive pink. Ears: Normal auditory acuity. Mouth: Dentition intact. No ulcers or lesions.  Neck: Supple, no lymphadenopathy or thyromegaly.  Lungs: Clear bilaterally to auscultation without wheezes, crackles or rhonchi. Heart: Regular rate and rhythm. 2/6 systolic murmur. No rub or gallop appreciated.  Abdomen: Soft, nontender, non distended. No masses. No hepatosplenomegaly. Normoactive bowel sounds x 4 quadrants.  Rectal: Small left external hemorrhoids without inflammation or bleeding. No stool or mass in the rectal vault. CMA Melissa present during exam.  Musculoskeletal: Symmetrical with no gross deformities. Skin: Warm and dry. No rash or lesions on visible extremities. Extremities: No edema. Neurological: Alert oriented x 4, no focal deficits.  Psychological:  Alert  and cooperative. Normal mood and affect.  ASSESSMENT AND PLAN:  1.  Painless hematochezia. Hg 12.4.  -Colonoscopy benefits and risks discussed including risk with sedation, risk of bleeding, perforation and infection  -Patient to call office if rectal bleeding recurs -Apply a small amount of Desitin inside the anal opening and to the external anal area tid as needed for anal or hemorrhoidal irritation/bleeding.  -Benefiber QD -Miralax QHS as needed to avoid straining   2.  Reflux symptoms, questionable odynophagia and esophageal dysphagia -EGD benefits and risks discussed including risk with sedation, risk of bleeding, perforation and infection   3. History of a heart murmur. ECHO in 2019 showed normal LV wall thickness with LVEF 65-70% and a grade 1 diastolic  dysfunction. Mild left atrial enlargement. Mildly calcified mitral annulus with trivial TR and MR.  Moderately sclerotic to mildly stenotic aortic valve.  -Recommend follow-up ECHO, defer to PCP  4. Elevated creatinine level -Follow up with PCP  Further recommendations to be determined after the above evaluation completed              CC:  Deland Pretty, MD

## 2021-02-14 ENCOUNTER — Other Ambulatory Visit: Payer: Self-pay

## 2021-02-14 ENCOUNTER — Encounter: Payer: Self-pay | Admitting: Nurse Practitioner

## 2021-02-14 ENCOUNTER — Ambulatory Visit: Payer: Medicare HMO | Admitting: Nurse Practitioner

## 2021-02-14 VITALS — BP 112/60 | HR 72 | Ht 61.75 in | Wt 169.2 lb

## 2021-02-14 DIAGNOSIS — K625 Hemorrhage of anus and rectum: Secondary | ICD-10-CM | POA: Diagnosis not present

## 2021-02-14 DIAGNOSIS — K219 Gastro-esophageal reflux disease without esophagitis: Secondary | ICD-10-CM | POA: Diagnosis not present

## 2021-02-14 NOTE — Patient Instructions (Addendum)
If you are age 66 or older, your body mass index should be between 23-30. Your Body mass index is 31.21 kg/m. If this is out of the aforementioned range listed, please consider follow up with your Primary Care Provider.  PROCEDURES: You have been scheduled for an endoscopy and colonoscopy. Please follow the written instructions given to you at your visit today. If you use inhalers (even only as needed), please bring them with you on the day of your procedure.  RECOMMENDATIONS: Benefiber- 1 tablespoon daily. Miralax- Dissolve one capful in 8 ounces of water and drink before bed. Desitin: Apply a small amount to the external anal area three times a day as needed.  Please call our office if your symptoms worsen.  It was great seeing you today! Thank you for entrusting me with your care and choosing [facility].  Noralyn Pick, CRNP

## 2021-02-20 NOTE — Progress Notes (Signed)
Agree with the assessment and plan as outlined by Colleen Kennedy-Smith, NP.   Polly Barner, DO, FACG [facility]   

## 2021-02-21 ENCOUNTER — Encounter: Payer: Medicare HMO | Admitting: Gastroenterology

## 2021-02-22 DIAGNOSIS — I10 Essential (primary) hypertension: Secondary | ICD-10-CM | POA: Diagnosis not present

## 2021-02-23 DIAGNOSIS — J309 Allergic rhinitis, unspecified: Secondary | ICD-10-CM | POA: Diagnosis not present

## 2021-02-23 DIAGNOSIS — I10 Essential (primary) hypertension: Secondary | ICD-10-CM | POA: Diagnosis not present

## 2021-02-23 DIAGNOSIS — R5381 Other malaise: Secondary | ICD-10-CM | POA: Diagnosis not present

## 2021-02-23 DIAGNOSIS — D849 Immunodeficiency, unspecified: Secondary | ICD-10-CM | POA: Diagnosis not present

## 2021-02-23 DIAGNOSIS — N1832 Chronic kidney disease, stage 3b: Secondary | ICD-10-CM | POA: Diagnosis not present

## 2021-02-23 DIAGNOSIS — R5383 Other fatigue: Secondary | ICD-10-CM | POA: Diagnosis not present

## 2021-02-23 DIAGNOSIS — M0609 Rheumatoid arthritis without rheumatoid factor, multiple sites: Secondary | ICD-10-CM | POA: Diagnosis not present

## 2021-02-23 DIAGNOSIS — Z0000 Encounter for general adult medical examination without abnormal findings: Secondary | ICD-10-CM | POA: Diagnosis not present

## 2021-02-23 DIAGNOSIS — J4531 Mild persistent asthma with (acute) exacerbation: Secondary | ICD-10-CM | POA: Diagnosis not present

## 2021-02-23 DIAGNOSIS — E669 Obesity, unspecified: Secondary | ICD-10-CM | POA: Diagnosis not present

## 2021-02-26 ENCOUNTER — Other Ambulatory Visit: Payer: Self-pay | Admitting: Internal Medicine

## 2021-02-26 DIAGNOSIS — I10 Essential (primary) hypertension: Secondary | ICD-10-CM

## 2021-03-01 DIAGNOSIS — J4531 Mild persistent asthma with (acute) exacerbation: Secondary | ICD-10-CM | POA: Diagnosis not present

## 2021-03-01 DIAGNOSIS — N1832 Chronic kidney disease, stage 3b: Secondary | ICD-10-CM | POA: Diagnosis not present

## 2021-03-06 DIAGNOSIS — I129 Hypertensive chronic kidney disease with stage 1 through stage 4 chronic kidney disease, or unspecified chronic kidney disease: Secondary | ICD-10-CM | POA: Diagnosis not present

## 2021-03-06 DIAGNOSIS — N1831 Chronic kidney disease, stage 3a: Secondary | ICD-10-CM | POA: Diagnosis not present

## 2021-03-06 DIAGNOSIS — M1990 Unspecified osteoarthritis, unspecified site: Secondary | ICD-10-CM | POA: Diagnosis not present

## 2021-03-08 DIAGNOSIS — N1832 Chronic kidney disease, stage 3b: Secondary | ICD-10-CM | POA: Diagnosis not present

## 2021-03-08 DIAGNOSIS — I10 Essential (primary) hypertension: Secondary | ICD-10-CM | POA: Diagnosis not present

## 2021-03-16 DIAGNOSIS — J209 Acute bronchitis, unspecified: Secondary | ICD-10-CM | POA: Diagnosis not present

## 2021-03-16 DIAGNOSIS — J0141 Acute recurrent pansinusitis: Secondary | ICD-10-CM | POA: Diagnosis not present

## 2021-03-16 DIAGNOSIS — Z20822 Contact with and (suspected) exposure to covid-19: Secondary | ICD-10-CM | POA: Diagnosis not present

## 2021-03-26 ENCOUNTER — Ambulatory Visit
Admission: RE | Admit: 2021-03-26 | Discharge: 2021-03-26 | Disposition: A | Discharge status: Home / Self Care | Payer: Medicare HMO | Source: Ambulatory Visit | Attending: Internal Medicine | Admitting: Internal Medicine

## 2021-03-26 DIAGNOSIS — I10 Essential (primary) hypertension: Secondary | ICD-10-CM

## 2021-03-28 DIAGNOSIS — M8589 Other specified disorders of bone density and structure, multiple sites: Secondary | ICD-10-CM | POA: Diagnosis not present

## 2021-03-28 DIAGNOSIS — I10 Essential (primary) hypertension: Secondary | ICD-10-CM | POA: Diagnosis not present

## 2021-03-28 DIAGNOSIS — M8580 Other specified disorders of bone density and structure, unspecified site: Secondary | ICD-10-CM | POA: Diagnosis not present

## 2021-03-28 DIAGNOSIS — I700 Atherosclerosis of aorta: Secondary | ICD-10-CM | POA: Diagnosis not present

## 2021-03-28 DIAGNOSIS — Z780 Asymptomatic menopausal state: Secondary | ICD-10-CM | POA: Diagnosis not present

## 2021-03-29 DIAGNOSIS — E669 Obesity, unspecified: Secondary | ICD-10-CM | POA: Diagnosis not present

## 2021-03-29 DIAGNOSIS — M0609 Rheumatoid arthritis without rheumatoid factor, multiple sites: Secondary | ICD-10-CM | POA: Diagnosis not present

## 2021-03-29 DIAGNOSIS — N1832 Chronic kidney disease, stage 3b: Secondary | ICD-10-CM | POA: Diagnosis not present

## 2021-03-29 DIAGNOSIS — R011 Cardiac murmur, unspecified: Secondary | ICD-10-CM | POA: Diagnosis not present

## 2021-03-29 DIAGNOSIS — I129 Hypertensive chronic kidney disease with stage 1 through stage 4 chronic kidney disease, or unspecified chronic kidney disease: Secondary | ICD-10-CM | POA: Diagnosis not present

## 2021-03-30 ENCOUNTER — Other Ambulatory Visit: Payer: Self-pay | Admitting: Internal Medicine

## 2021-03-30 DIAGNOSIS — N1832 Chronic kidney disease, stage 3b: Secondary | ICD-10-CM

## 2021-04-05 DIAGNOSIS — N1831 Chronic kidney disease, stage 3a: Secondary | ICD-10-CM | POA: Diagnosis not present

## 2021-04-05 DIAGNOSIS — M1990 Unspecified osteoarthritis, unspecified site: Secondary | ICD-10-CM | POA: Diagnosis not present

## 2021-04-05 DIAGNOSIS — I129 Hypertensive chronic kidney disease with stage 1 through stage 4 chronic kidney disease, or unspecified chronic kidney disease: Secondary | ICD-10-CM | POA: Diagnosis not present

## 2021-04-17 DIAGNOSIS — N1832 Chronic kidney disease, stage 3b: Secondary | ICD-10-CM | POA: Diagnosis not present

## 2021-04-26 ENCOUNTER — Ambulatory Visit
Admission: RE | Admit: 2021-04-26 | Discharge: 2021-04-26 | Disposition: A | Discharge status: Home / Self Care | Payer: Medicare HMO | Source: Ambulatory Visit | Attending: Internal Medicine | Admitting: Internal Medicine

## 2021-04-26 DIAGNOSIS — N1832 Chronic kidney disease, stage 3b: Secondary | ICD-10-CM

## 2021-05-02 DIAGNOSIS — M7052 Other bursitis of knee, left knee: Secondary | ICD-10-CM | POA: Diagnosis not present

## 2021-05-02 DIAGNOSIS — M25562 Pain in left knee: Secondary | ICD-10-CM | POA: Diagnosis not present

## 2021-05-06 DIAGNOSIS — I129 Hypertensive chronic kidney disease with stage 1 through stage 4 chronic kidney disease, or unspecified chronic kidney disease: Secondary | ICD-10-CM | POA: Diagnosis not present

## 2021-05-06 DIAGNOSIS — M1990 Unspecified osteoarthritis, unspecified site: Secondary | ICD-10-CM | POA: Diagnosis not present

## 2021-05-06 DIAGNOSIS — N1831 Chronic kidney disease, stage 3a: Secondary | ICD-10-CM | POA: Diagnosis not present

## 2021-05-10 DIAGNOSIS — I700 Atherosclerosis of aorta: Secondary | ICD-10-CM | POA: Diagnosis not present

## 2021-06-06 DIAGNOSIS — M1990 Unspecified osteoarthritis, unspecified site: Secondary | ICD-10-CM | POA: Diagnosis not present

## 2021-06-06 DIAGNOSIS — I129 Hypertensive chronic kidney disease with stage 1 through stage 4 chronic kidney disease, or unspecified chronic kidney disease: Secondary | ICD-10-CM | POA: Diagnosis not present

## 2021-06-06 DIAGNOSIS — N1831 Chronic kidney disease, stage 3a: Secondary | ICD-10-CM | POA: Diagnosis not present

## 2021-06-18 DIAGNOSIS — M1712 Unilateral primary osteoarthritis, left knee: Secondary | ICD-10-CM | POA: Diagnosis not present

## 2021-06-22 DIAGNOSIS — M25562 Pain in left knee: Secondary | ICD-10-CM | POA: Diagnosis not present

## 2021-06-27 DIAGNOSIS — M25562 Pain in left knee: Secondary | ICD-10-CM | POA: Diagnosis not present

## 2021-06-27 DIAGNOSIS — R5383 Other fatigue: Secondary | ICD-10-CM | POA: Diagnosis not present

## 2021-06-27 DIAGNOSIS — R69 Illness, unspecified: Secondary | ICD-10-CM | POA: Diagnosis not present

## 2021-06-27 DIAGNOSIS — S80862A Insect bite (nonvenomous), left lower leg, initial encounter: Secondary | ICD-10-CM | POA: Diagnosis not present

## 2021-06-27 DIAGNOSIS — W57XXXA Bitten or stung by nonvenomous insect and other nonvenomous arthropods, initial encounter: Secondary | ICD-10-CM | POA: Diagnosis not present

## 2021-06-27 DIAGNOSIS — Z833 Family history of diabetes mellitus: Secondary | ICD-10-CM | POA: Diagnosis not present

## 2021-06-27 DIAGNOSIS — I10 Essential (primary) hypertension: Secondary | ICD-10-CM | POA: Diagnosis not present

## 2021-07-03 DIAGNOSIS — N1832 Chronic kidney disease, stage 3b: Secondary | ICD-10-CM | POA: Diagnosis not present

## 2021-07-03 DIAGNOSIS — I129 Hypertensive chronic kidney disease with stage 1 through stage 4 chronic kidney disease, or unspecified chronic kidney disease: Secondary | ICD-10-CM | POA: Diagnosis not present

## 2021-07-03 DIAGNOSIS — M0609 Rheumatoid arthritis without rheumatoid factor, multiple sites: Secondary | ICD-10-CM | POA: Diagnosis not present

## 2021-07-03 DIAGNOSIS — K760 Fatty (change of) liver, not elsewhere classified: Secondary | ICD-10-CM | POA: Diagnosis not present

## 2021-07-03 DIAGNOSIS — E669 Obesity, unspecified: Secondary | ICD-10-CM | POA: Diagnosis not present

## 2021-07-06 DIAGNOSIS — N1831 Chronic kidney disease, stage 3a: Secondary | ICD-10-CM | POA: Diagnosis not present

## 2021-07-06 DIAGNOSIS — M1990 Unspecified osteoarthritis, unspecified site: Secondary | ICD-10-CM | POA: Diagnosis not present

## 2021-07-06 DIAGNOSIS — I129 Hypertensive chronic kidney disease with stage 1 through stage 4 chronic kidney disease, or unspecified chronic kidney disease: Secondary | ICD-10-CM | POA: Diagnosis not present

## 2021-07-23 DIAGNOSIS — M25562 Pain in left knee: Secondary | ICD-10-CM | POA: Diagnosis not present

## 2021-07-25 DIAGNOSIS — N1832 Chronic kidney disease, stage 3b: Secondary | ICD-10-CM | POA: Diagnosis not present

## 2021-07-28 IMAGING — CT CT CARDIAC CORONARY ARTERY CALCIUM SCORE
3 series · 14 of 20 positions shown, 16 images · non-contrast
Comparison: None.

CLINICAL DATA: 65-year-old Caucasian female with history
hypertension and family history of heart disease.

EXAM:
CT CARDIAC CORONARY ARTERY CALCIUM SCORE
TECHNIQUE: Non-contrast imaging through the heart was performed using
prospective ECG gating. Image post processing was performed on an
independent workstation, allowing for quantitative analysis of the
heart and coronary arteries. Note that this exam targets the heart
and the chest was not imaged in its entirety.

[Series 2: calcium scoring 2.00 qr36 bestdiast 69% hrt calciu · axial · 0.47mm/px · z∈[+1678,+1750]mm · 4 of 60 slices shown]
[im 12/60  vessel]
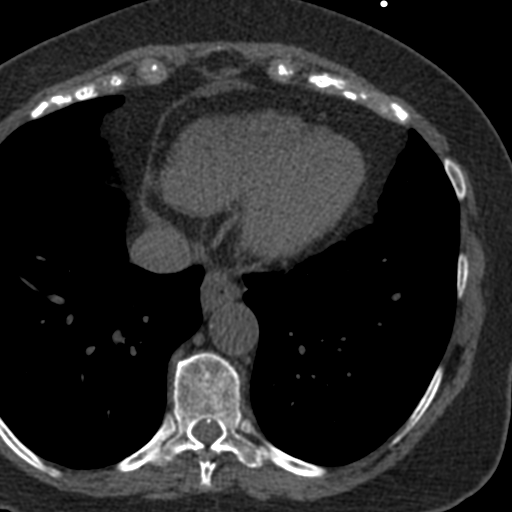
[im 24/60  vessel]
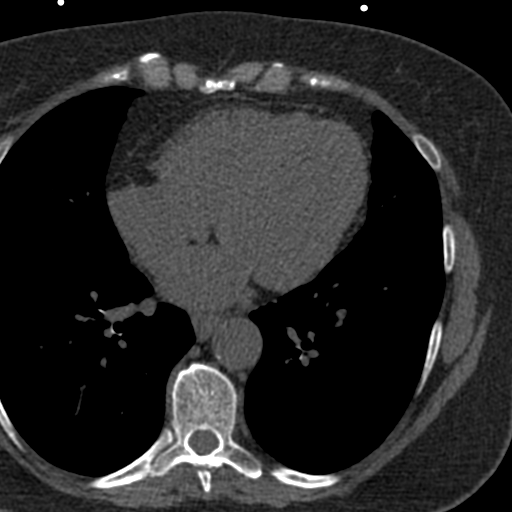
[im 36/60  vessel]
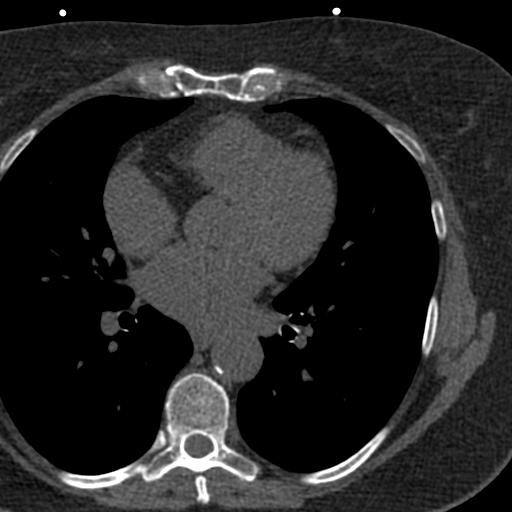
[im 48/60  vessel]
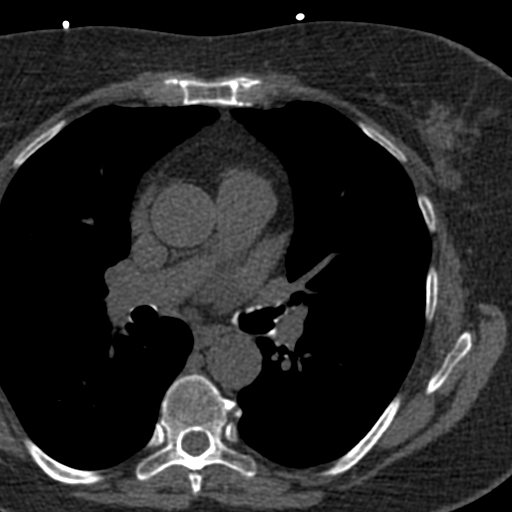

[Series 3: calcium scoring 2.00 br40 bestdiast 69% axial · axial · 0.60mm/px · z∈[+1674,+1754]mm · 5 of 60 slices shown, 7 images]
[im 10/60  vessel]
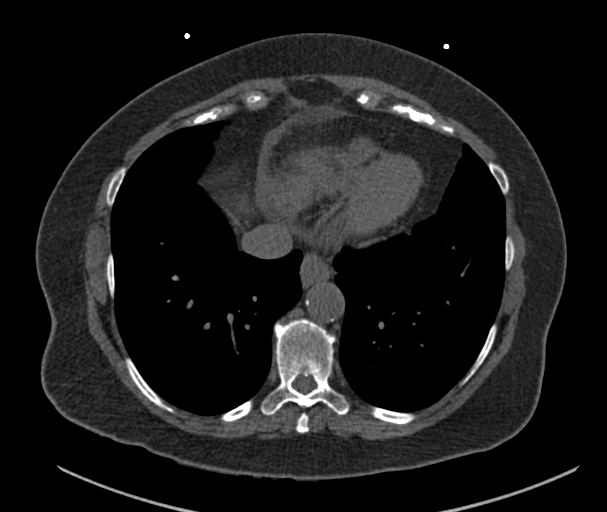
[im 10/60  lung]
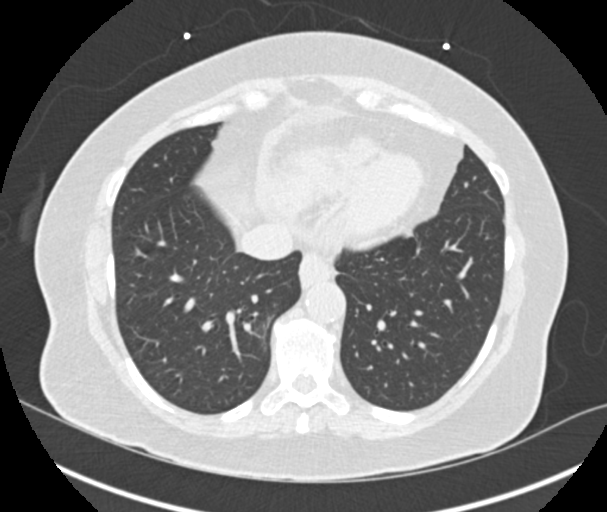
[im 20/60  vessel]
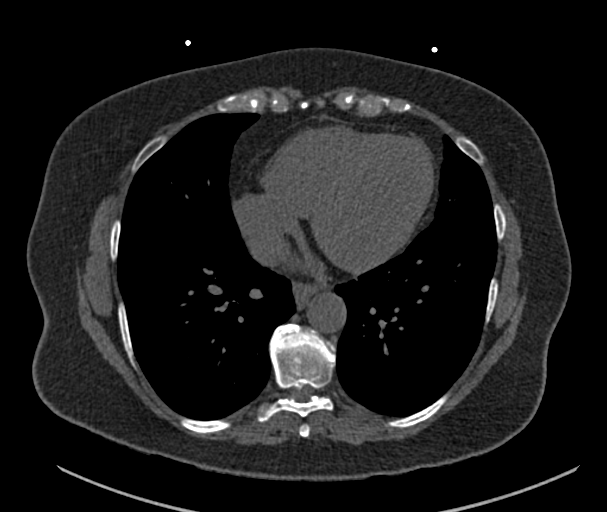
[im 30/60  vessel]
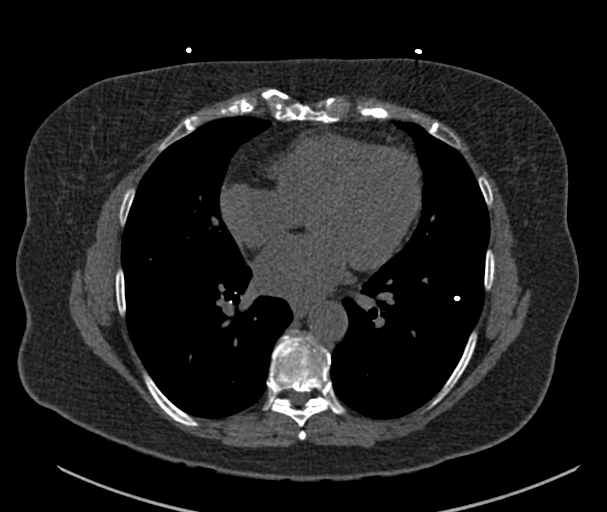
[im 40/60  vessel]
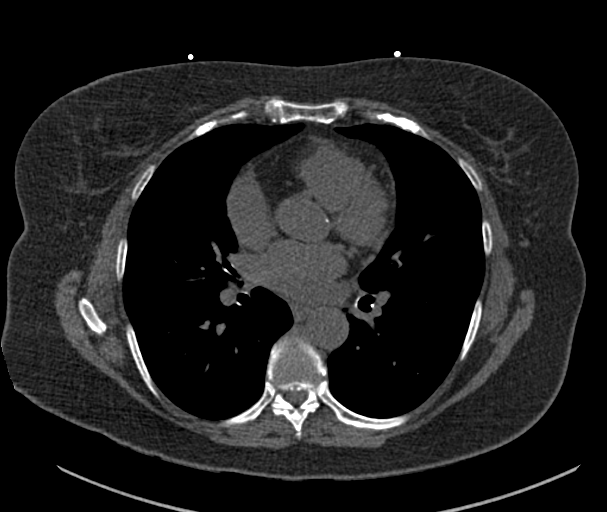
[im 50/60  vessel]
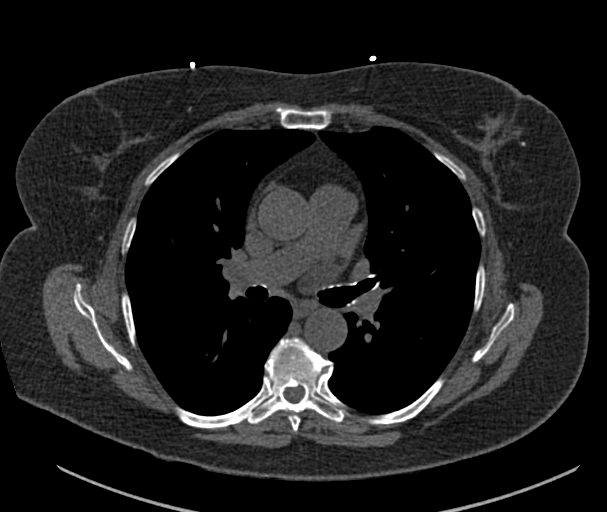
[im 50/60  lung]
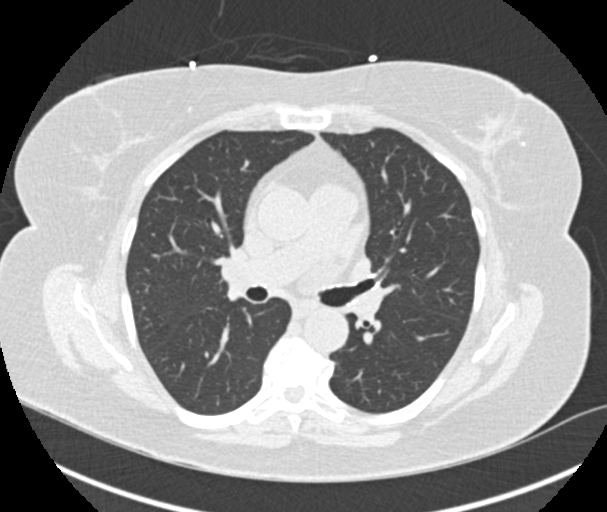

[Series 9: calcium scoring 2.00 br60 bestdiast 69% lungs · axial · 0.60mm/px · z∈[+1674,+1754]mm · 5 of 60 slices shown]
[im 10/60  vessel]
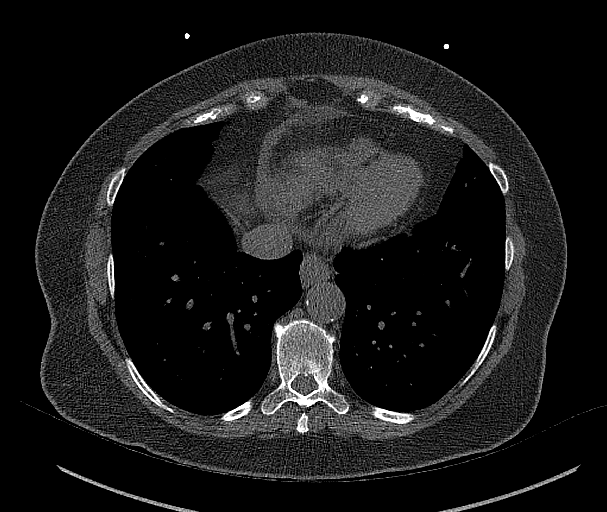
[im 20/60  vessel]
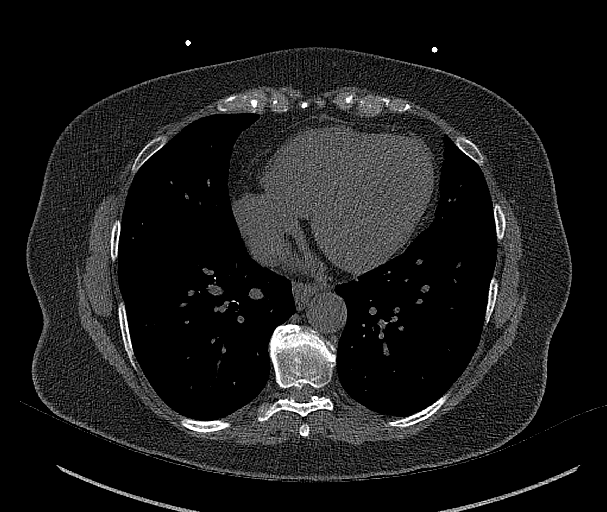
[im 30/60  vessel]
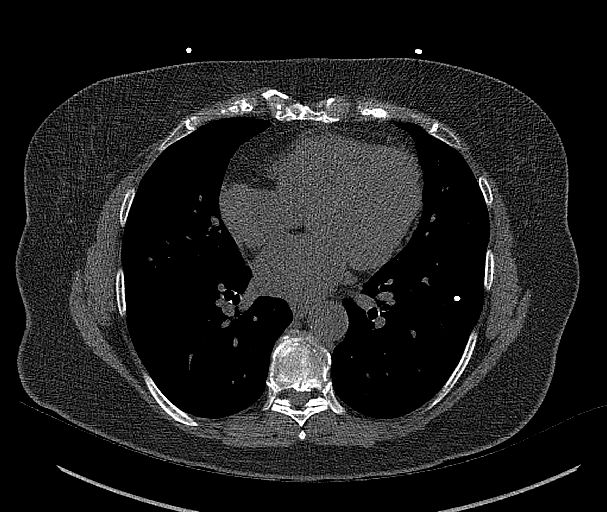
[im 40/60  vessel]
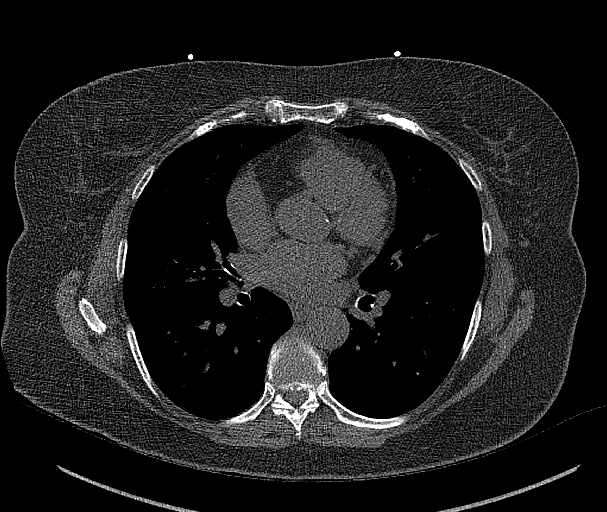
[im 50/60  vessel]
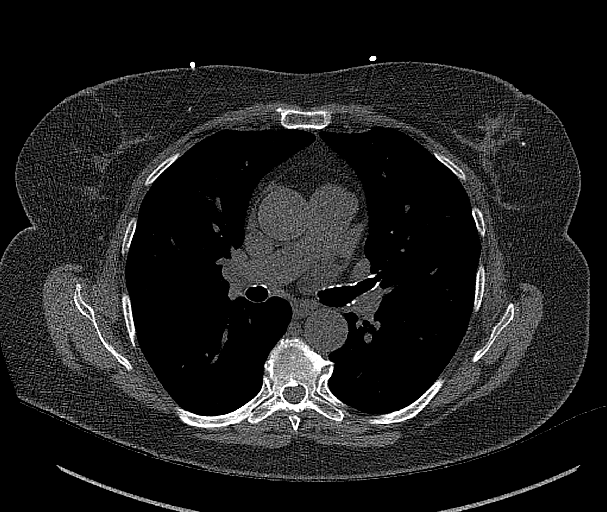

[14 of 20 positions shown; findings below may reference images not displayed]

FINDINGS: CORONARY CALCIUM SCORES:

Left Main: 0

LAD: 0

LCx: 0

RCA: 0

Total Agatston Score: 0

[HOSPITAL] percentile: 0

AORTA MEASUREMENTS:

Ascending Aorta: 30 mm

Descending Aorta: 25 mm

OTHER FINDINGS:

The heart size is within normal limits. No pericardial fluid
identified. Visualized segments of the thoracic aorta and central
pulmonary arteries are normal in caliber. Mild amount of calcified
plaque is present in the descending thoracic aorta. The visualized
mediastinum and hilar regions demonstrate no lymphadenopathy or
masses. Probable small hiatal hernia. 4 mm calcified granuloma
present in the left lower lobe. Visualized lungs show no evidence of
pulmonary edema, consolidation, pneumothorax, nodule or pleural
fluid. Visualized upper abdomen and bony structures are
unremarkable.
IMPRESSION: 1. Coronary calcium score of 0.
2. Atherosclerosis of the descending thoracic aorta.
3. Probable small hiatal hernia.

## 2021-07-31 DIAGNOSIS — M25562 Pain in left knee: Secondary | ICD-10-CM | POA: Diagnosis not present

## 2021-08-06 DIAGNOSIS — M1990 Unspecified osteoarthritis, unspecified site: Secondary | ICD-10-CM | POA: Diagnosis not present

## 2021-08-06 DIAGNOSIS — I129 Hypertensive chronic kidney disease with stage 1 through stage 4 chronic kidney disease, or unspecified chronic kidney disease: Secondary | ICD-10-CM | POA: Diagnosis not present

## 2021-08-06 DIAGNOSIS — N1831 Chronic kidney disease, stage 3a: Secondary | ICD-10-CM | POA: Diagnosis not present

## 2021-09-04 DIAGNOSIS — H524 Presbyopia: Secondary | ICD-10-CM | POA: Diagnosis not present

## 2021-09-13 DIAGNOSIS — M722 Plantar fascial fibromatosis: Secondary | ICD-10-CM | POA: Diagnosis not present

## 2021-09-13 DIAGNOSIS — M79672 Pain in left foot: Secondary | ICD-10-CM | POA: Diagnosis not present

## 2021-09-13 DIAGNOSIS — M7662 Achilles tendinitis, left leg: Secondary | ICD-10-CM | POA: Diagnosis not present

## 2021-09-28 DIAGNOSIS — J069 Acute upper respiratory infection, unspecified: Secondary | ICD-10-CM | POA: Diagnosis not present

## 2021-09-28 DIAGNOSIS — Z20822 Contact with and (suspected) exposure to covid-19: Secondary | ICD-10-CM | POA: Diagnosis not present

## 2021-09-28 DIAGNOSIS — H6692 Otitis media, unspecified, left ear: Secondary | ICD-10-CM | POA: Diagnosis not present

## 2021-09-28 DIAGNOSIS — R059 Cough, unspecified: Secondary | ICD-10-CM | POA: Diagnosis not present

## 2021-10-04 DIAGNOSIS — H6692 Otitis media, unspecified, left ear: Secondary | ICD-10-CM | POA: Diagnosis not present

## 2021-11-26 DIAGNOSIS — M25562 Pain in left knee: Secondary | ICD-10-CM | POA: Diagnosis not present

## 2021-12-28 DIAGNOSIS — N1832 Chronic kidney disease, stage 3b: Secondary | ICD-10-CM | POA: Diagnosis not present

## 2022-01-01 DIAGNOSIS — I129 Hypertensive chronic kidney disease with stage 1 through stage 4 chronic kidney disease, or unspecified chronic kidney disease: Secondary | ICD-10-CM | POA: Diagnosis not present

## 2022-01-01 DIAGNOSIS — E669 Obesity, unspecified: Secondary | ICD-10-CM | POA: Diagnosis not present

## 2022-01-01 DIAGNOSIS — N1832 Chronic kidney disease, stage 3b: Secondary | ICD-10-CM | POA: Diagnosis not present

## 2022-01-01 DIAGNOSIS — K760 Fatty (change of) liver, not elsewhere classified: Secondary | ICD-10-CM | POA: Diagnosis not present

## 2022-01-16 DIAGNOSIS — I10 Essential (primary) hypertension: Secondary | ICD-10-CM | POA: Diagnosis not present

## 2022-01-28 DIAGNOSIS — J0140 Acute pansinusitis, unspecified: Secondary | ICD-10-CM | POA: Diagnosis not present

## 2022-02-03 DIAGNOSIS — N1831 Chronic kidney disease, stage 3a: Secondary | ICD-10-CM | POA: Diagnosis not present

## 2022-02-03 DIAGNOSIS — I129 Hypertensive chronic kidney disease with stage 1 through stage 4 chronic kidney disease, or unspecified chronic kidney disease: Secondary | ICD-10-CM | POA: Diagnosis not present

## 2022-02-03 DIAGNOSIS — M1990 Unspecified osteoarthritis, unspecified site: Secondary | ICD-10-CM | POA: Diagnosis not present

## 2022-02-13 DIAGNOSIS — R0981 Nasal congestion: Secondary | ICD-10-CM | POA: Diagnosis not present

## 2022-02-13 DIAGNOSIS — G500 Trigeminal neuralgia: Secondary | ICD-10-CM | POA: Diagnosis not present

## 2022-02-13 DIAGNOSIS — R1319 Other dysphagia: Secondary | ICD-10-CM | POA: Diagnosis not present

## 2022-02-13 DIAGNOSIS — I10 Essential (primary) hypertension: Secondary | ICD-10-CM | POA: Diagnosis not present

## 2022-02-19 ENCOUNTER — Encounter (INDEPENDENT_AMBULATORY_CARE_PROVIDER_SITE_OTHER): Payer: Self-pay | Admitting: *Deleted

## 2022-02-22 DIAGNOSIS — I700 Atherosclerosis of aorta: Secondary | ICD-10-CM | POA: Diagnosis not present

## 2022-02-22 DIAGNOSIS — I10 Essential (primary) hypertension: Secondary | ICD-10-CM | POA: Diagnosis not present

## 2022-02-27 DIAGNOSIS — J4531 Mild persistent asthma with (acute) exacerbation: Secondary | ICD-10-CM | POA: Diagnosis not present

## 2022-02-27 DIAGNOSIS — N1832 Chronic kidney disease, stage 3b: Secondary | ICD-10-CM | POA: Diagnosis not present

## 2022-02-27 DIAGNOSIS — M0609 Rheumatoid arthritis without rheumatoid factor, multiple sites: Secondary | ICD-10-CM | POA: Diagnosis not present

## 2022-02-27 DIAGNOSIS — J309 Allergic rhinitis, unspecified: Secondary | ICD-10-CM | POA: Diagnosis not present

## 2022-02-27 DIAGNOSIS — R1319 Other dysphagia: Secondary | ICD-10-CM | POA: Diagnosis not present

## 2022-02-27 DIAGNOSIS — D229 Melanocytic nevi, unspecified: Secondary | ICD-10-CM | POA: Diagnosis not present

## 2022-02-27 DIAGNOSIS — I10 Essential (primary) hypertension: Secondary | ICD-10-CM | POA: Diagnosis not present

## 2022-02-27 DIAGNOSIS — N1831 Chronic kidney disease, stage 3a: Secondary | ICD-10-CM | POA: Diagnosis not present

## 2022-02-27 DIAGNOSIS — N184 Chronic kidney disease, stage 4 (severe): Secondary | ICD-10-CM | POA: Diagnosis not present

## 2022-02-27 DIAGNOSIS — G43009 Migraine without aura, not intractable, without status migrainosus: Secondary | ICD-10-CM | POA: Diagnosis not present

## 2022-02-27 DIAGNOSIS — Z0000 Encounter for general adult medical examination without abnormal findings: Secondary | ICD-10-CM | POA: Diagnosis not present

## 2022-02-27 DIAGNOSIS — I700 Atherosclerosis of aorta: Secondary | ICD-10-CM | POA: Diagnosis not present

## 2022-03-06 DIAGNOSIS — N1832 Chronic kidney disease, stage 3b: Secondary | ICD-10-CM | POA: Diagnosis not present

## 2022-03-07 DIAGNOSIS — E669 Obesity, unspecified: Secondary | ICD-10-CM | POA: Diagnosis not present

## 2022-03-07 DIAGNOSIS — N1832 Chronic kidney disease, stage 3b: Secondary | ICD-10-CM | POA: Diagnosis not present

## 2022-03-07 DIAGNOSIS — I129 Hypertensive chronic kidney disease with stage 1 through stage 4 chronic kidney disease, or unspecified chronic kidney disease: Secondary | ICD-10-CM | POA: Diagnosis not present

## 2022-03-07 DIAGNOSIS — M0609 Rheumatoid arthritis without rheumatoid factor, multiple sites: Secondary | ICD-10-CM | POA: Diagnosis not present

## 2022-04-22 ENCOUNTER — Ambulatory Visit (INDEPENDENT_AMBULATORY_CARE_PROVIDER_SITE_OTHER): Payer: Medicare HMO | Admitting: Gastroenterology

## 2022-04-24 ENCOUNTER — Other Ambulatory Visit: Payer: Self-pay | Admitting: Internal Medicine

## 2022-04-24 DIAGNOSIS — Z1231 Encounter for screening mammogram for malignant neoplasm of breast: Secondary | ICD-10-CM

## 2022-05-02 ENCOUNTER — Ambulatory Visit
Admission: RE | Admit: 2022-05-02 | Discharge: 2022-05-02 | Disposition: A | Discharge status: Home / Self Care | Payer: Medicare HMO | Source: Ambulatory Visit | Attending: Internal Medicine | Admitting: Internal Medicine

## 2022-05-02 DIAGNOSIS — Z1231 Encounter for screening mammogram for malignant neoplasm of breast: Secondary | ICD-10-CM

## 2022-05-13 ENCOUNTER — Ambulatory Visit (INDEPENDENT_AMBULATORY_CARE_PROVIDER_SITE_OTHER): Payer: Medicare HMO | Admitting: Gastroenterology

## 2022-05-16 DIAGNOSIS — I10 Essential (primary) hypertension: Secondary | ICD-10-CM | POA: Diagnosis not present

## 2022-05-16 DIAGNOSIS — R600 Localized edema: Secondary | ICD-10-CM | POA: Diagnosis not present

## 2022-05-16 DIAGNOSIS — N1831 Chronic kidney disease, stage 3a: Secondary | ICD-10-CM | POA: Diagnosis not present

## 2022-05-16 DIAGNOSIS — I700 Atherosclerosis of aorta: Secondary | ICD-10-CM | POA: Diagnosis not present

## 2022-05-20 ENCOUNTER — Other Ambulatory Visit: Payer: Self-pay | Admitting: Neurology

## 2022-05-20 ENCOUNTER — Other Ambulatory Visit (HOSPITAL_COMMUNITY): Payer: Self-pay | Admitting: Neurology

## 2022-05-20 DIAGNOSIS — G4452 New daily persistent headache (NDPH): Secondary | ICD-10-CM | POA: Diagnosis not present

## 2022-05-20 DIAGNOSIS — G44209 Tension-type headache, unspecified, not intractable: Secondary | ICD-10-CM | POA: Diagnosis not present

## 2022-05-20 DIAGNOSIS — L298 Other pruritus: Secondary | ICD-10-CM | POA: Diagnosis not present

## 2022-05-20 DIAGNOSIS — G4459 Other complicated headache syndrome: Secondary | ICD-10-CM | POA: Diagnosis not present

## 2022-05-20 DIAGNOSIS — D485 Neoplasm of uncertain behavior of skin: Secondary | ICD-10-CM | POA: Diagnosis not present

## 2022-05-20 DIAGNOSIS — D225 Melanocytic nevi of trunk: Secondary | ICD-10-CM | POA: Diagnosis not present

## 2022-05-20 DIAGNOSIS — M542 Cervicalgia: Secondary | ICD-10-CM | POA: Diagnosis not present

## 2022-05-20 DIAGNOSIS — E663 Overweight: Secondary | ICD-10-CM | POA: Diagnosis not present

## 2022-05-20 DIAGNOSIS — L821 Other seborrheic keratosis: Secondary | ICD-10-CM | POA: Diagnosis not present

## 2022-06-03 DIAGNOSIS — N1832 Chronic kidney disease, stage 3b: Secondary | ICD-10-CM | POA: Diagnosis not present

## 2022-06-05 ENCOUNTER — Ambulatory Visit (HOSPITAL_COMMUNITY)
Admission: RE | Admit: 2022-06-05 | Discharge: 2022-06-05 | Disposition: A | Discharge status: Home / Self Care | Payer: Medicare HMO | Source: Ambulatory Visit | Attending: Neurology | Admitting: Neurology

## 2022-06-05 DIAGNOSIS — G4452 New daily persistent headache (NDPH): Secondary | ICD-10-CM | POA: Insufficient documentation

## 2022-06-05 DIAGNOSIS — R519 Headache, unspecified: Secondary | ICD-10-CM | POA: Diagnosis not present

## 2022-06-11 DIAGNOSIS — I129 Hypertensive chronic kidney disease with stage 1 through stage 4 chronic kidney disease, or unspecified chronic kidney disease: Secondary | ICD-10-CM | POA: Diagnosis not present

## 2022-06-11 DIAGNOSIS — N2581 Secondary hyperparathyroidism of renal origin: Secondary | ICD-10-CM | POA: Diagnosis not present

## 2022-06-11 DIAGNOSIS — K760 Fatty (change of) liver, not elsewhere classified: Secondary | ICD-10-CM | POA: Diagnosis not present

## 2022-06-11 DIAGNOSIS — N1832 Chronic kidney disease, stage 3b: Secondary | ICD-10-CM | POA: Diagnosis not present

## 2022-06-11 DIAGNOSIS — E669 Obesity, unspecified: Secondary | ICD-10-CM | POA: Diagnosis not present

## 2022-07-22 DIAGNOSIS — L905 Scar conditions and fibrosis of skin: Secondary | ICD-10-CM | POA: Diagnosis not present

## 2022-07-22 DIAGNOSIS — D225 Melanocytic nevi of trunk: Secondary | ICD-10-CM | POA: Diagnosis not present

## 2022-07-29 DIAGNOSIS — M542 Cervicalgia: Secondary | ICD-10-CM | POA: Diagnosis not present

## 2022-07-29 DIAGNOSIS — G4700 Insomnia, unspecified: Secondary | ICD-10-CM | POA: Diagnosis not present

## 2022-07-29 DIAGNOSIS — E663 Overweight: Secondary | ICD-10-CM | POA: Diagnosis not present

## 2022-07-29 DIAGNOSIS — G4459 Other complicated headache syndrome: Secondary | ICD-10-CM | POA: Diagnosis not present

## 2022-08-09 DIAGNOSIS — M25561 Pain in right knee: Secondary | ICD-10-CM | POA: Diagnosis not present

## 2022-09-17 DIAGNOSIS — R3915 Urgency of urination: Secondary | ICD-10-CM | POA: Diagnosis not present

## 2022-09-17 DIAGNOSIS — Z01818 Encounter for other preprocedural examination: Secondary | ICD-10-CM | POA: Diagnosis not present

## 2022-10-10 DIAGNOSIS — H524 Presbyopia: Secondary | ICD-10-CM | POA: Diagnosis not present

## 2022-10-15 DIAGNOSIS — N2581 Secondary hyperparathyroidism of renal origin: Secondary | ICD-10-CM | POA: Diagnosis not present

## 2022-10-15 DIAGNOSIS — I129 Hypertensive chronic kidney disease with stage 1 through stage 4 chronic kidney disease, or unspecified chronic kidney disease: Secondary | ICD-10-CM | POA: Diagnosis not present

## 2022-10-15 DIAGNOSIS — M549 Dorsalgia, unspecified: Secondary | ICD-10-CM | POA: Diagnosis not present

## 2022-10-15 DIAGNOSIS — N1832 Chronic kidney disease, stage 3b: Secondary | ICD-10-CM | POA: Diagnosis not present

## 2022-10-15 DIAGNOSIS — K760 Fatty (change of) liver, not elsewhere classified: Secondary | ICD-10-CM | POA: Diagnosis not present

## 2022-10-15 DIAGNOSIS — E669 Obesity, unspecified: Secondary | ICD-10-CM | POA: Diagnosis not present

## 2022-12-27 DIAGNOSIS — H26493 Other secondary cataract, bilateral: Secondary | ICD-10-CM | POA: Diagnosis not present

## 2022-12-27 DIAGNOSIS — Z961 Presence of intraocular lens: Secondary | ICD-10-CM | POA: Diagnosis not present

## 2022-12-27 DIAGNOSIS — H02831 Dermatochalasis of right upper eyelid: Secondary | ICD-10-CM | POA: Diagnosis not present

## 2022-12-27 DIAGNOSIS — H26492 Other secondary cataract, left eye: Secondary | ICD-10-CM | POA: Diagnosis not present

## 2022-12-27 DIAGNOSIS — H18413 Arcus senilis, bilateral: Secondary | ICD-10-CM | POA: Diagnosis not present

## 2023-01-02 DIAGNOSIS — H26491 Other secondary cataract, right eye: Secondary | ICD-10-CM | POA: Diagnosis not present

## 2023-03-25 DIAGNOSIS — I10 Essential (primary) hypertension: Secondary | ICD-10-CM | POA: Diagnosis not present

## 2023-04-07 DIAGNOSIS — M0609 Rheumatoid arthritis without rheumatoid factor, multiple sites: Secondary | ICD-10-CM | POA: Diagnosis not present

## 2023-04-07 DIAGNOSIS — N184 Chronic kidney disease, stage 4 (severe): Secondary | ICD-10-CM | POA: Diagnosis not present

## 2023-04-07 DIAGNOSIS — I700 Atherosclerosis of aorta: Secondary | ICD-10-CM | POA: Diagnosis not present

## 2023-04-07 DIAGNOSIS — J4531 Mild persistent asthma with (acute) exacerbation: Secondary | ICD-10-CM | POA: Diagnosis not present

## 2023-04-07 DIAGNOSIS — J309 Allergic rhinitis, unspecified: Secondary | ICD-10-CM | POA: Diagnosis not present

## 2023-04-07 DIAGNOSIS — Z0000 Encounter for general adult medical examination without abnormal findings: Secondary | ICD-10-CM | POA: Diagnosis not present

## 2023-04-07 DIAGNOSIS — I10 Essential (primary) hypertension: Secondary | ICD-10-CM | POA: Diagnosis not present

## 2023-05-08 DIAGNOSIS — I10 Essential (primary) hypertension: Secondary | ICD-10-CM | POA: Diagnosis not present

## 2023-05-08 DIAGNOSIS — M8589 Other specified disorders of bone density and structure, multiple sites: Secondary | ICD-10-CM | POA: Diagnosis not present

## 2023-05-08 DIAGNOSIS — N184 Chronic kidney disease, stage 4 (severe): Secondary | ICD-10-CM | POA: Diagnosis not present

## 2023-05-13 DIAGNOSIS — N1832 Chronic kidney disease, stage 3b: Secondary | ICD-10-CM | POA: Diagnosis not present

## 2023-05-13 DIAGNOSIS — K760 Fatty (change of) liver, not elsewhere classified: Secondary | ICD-10-CM | POA: Diagnosis not present

## 2023-05-13 DIAGNOSIS — N2581 Secondary hyperparathyroidism of renal origin: Secondary | ICD-10-CM | POA: Diagnosis not present

## 2023-05-13 DIAGNOSIS — E669 Obesity, unspecified: Secondary | ICD-10-CM | POA: Diagnosis not present

## 2023-05-13 DIAGNOSIS — I129 Hypertensive chronic kidney disease with stage 1 through stage 4 chronic kidney disease, or unspecified chronic kidney disease: Secondary | ICD-10-CM | POA: Diagnosis not present

## 2023-05-22 DIAGNOSIS — M79672 Pain in left foot: Secondary | ICD-10-CM | POA: Diagnosis not present

## 2023-05-22 DIAGNOSIS — M9262 Juvenile osteochondrosis of tarsus, left ankle: Secondary | ICD-10-CM | POA: Diagnosis not present

## 2023-05-22 DIAGNOSIS — M7752 Other enthesopathy of left foot: Secondary | ICD-10-CM | POA: Diagnosis not present

## 2023-06-03 DIAGNOSIS — N1832 Chronic kidney disease, stage 3b: Secondary | ICD-10-CM | POA: Diagnosis not present

## 2023-06-19 DIAGNOSIS — M79671 Pain in right foot: Secondary | ICD-10-CM | POA: Diagnosis not present

## 2023-06-19 DIAGNOSIS — M9261 Juvenile osteochondrosis of tarsus, right ankle: Secondary | ICD-10-CM | POA: Diagnosis not present

## 2023-06-19 DIAGNOSIS — M7661 Achilles tendinitis, right leg: Secondary | ICD-10-CM | POA: Diagnosis not present

## 2023-11-06 DIAGNOSIS — M545 Low back pain, unspecified: Secondary | ICD-10-CM | POA: Diagnosis not present

## 2023-11-11 DIAGNOSIS — N1832 Chronic kidney disease, stage 3b: Secondary | ICD-10-CM | POA: Diagnosis not present

## 2023-11-12 DIAGNOSIS — M5451 Vertebrogenic low back pain: Secondary | ICD-10-CM | POA: Diagnosis not present

## 2023-11-12 DIAGNOSIS — M51369 Other intervertebral disc degeneration, lumbar region without mention of lumbar back pain or lower extremity pain: Secondary | ICD-10-CM | POA: Diagnosis not present

## 2023-11-20 DIAGNOSIS — I129 Hypertensive chronic kidney disease with stage 1 through stage 4 chronic kidney disease, or unspecified chronic kidney disease: Secondary | ICD-10-CM | POA: Diagnosis not present

## 2023-11-20 DIAGNOSIS — K760 Fatty (change of) liver, not elsewhere classified: Secondary | ICD-10-CM | POA: Diagnosis not present

## 2023-11-20 DIAGNOSIS — N184 Chronic kidney disease, stage 4 (severe): Secondary | ICD-10-CM | POA: Diagnosis not present

## 2023-11-20 DIAGNOSIS — M549 Dorsalgia, unspecified: Secondary | ICD-10-CM | POA: Diagnosis not present

## 2023-11-20 DIAGNOSIS — E669 Obesity, unspecified: Secondary | ICD-10-CM | POA: Diagnosis not present

## 2023-11-20 DIAGNOSIS — N2581 Secondary hyperparathyroidism of renal origin: Secondary | ICD-10-CM | POA: Diagnosis not present

## 2024-01-06 DIAGNOSIS — R7989 Other specified abnormal findings of blood chemistry: Secondary | ICD-10-CM | POA: Diagnosis not present

## 2024-01-09 DIAGNOSIS — R7989 Other specified abnormal findings of blood chemistry: Secondary | ICD-10-CM | POA: Diagnosis not present

## 2024-01-09 DIAGNOSIS — R799 Abnormal finding of blood chemistry, unspecified: Secondary | ICD-10-CM | POA: Diagnosis not present

## 2024-01-09 DIAGNOSIS — N184 Chronic kidney disease, stage 4 (severe): Secondary | ICD-10-CM | POA: Diagnosis not present

## 2024-02-03 DIAGNOSIS — N2581 Secondary hyperparathyroidism of renal origin: Secondary | ICD-10-CM | POA: Diagnosis not present

## 2024-02-03 DIAGNOSIS — J45909 Unspecified asthma, uncomplicated: Secondary | ICD-10-CM | POA: Diagnosis not present

## 2024-02-03 DIAGNOSIS — N184 Chronic kidney disease, stage 4 (severe): Secondary | ICD-10-CM | POA: Diagnosis not present

## 2024-02-03 DIAGNOSIS — I150 Renovascular hypertension: Secondary | ICD-10-CM | POA: Diagnosis not present

## 2024-02-04 DIAGNOSIS — R251 Tremor, unspecified: Secondary | ICD-10-CM | POA: Diagnosis not present

## 2024-02-04 DIAGNOSIS — R42 Dizziness and giddiness: Secondary | ICD-10-CM | POA: Diagnosis not present

## 2024-02-04 DIAGNOSIS — M542 Cervicalgia: Secondary | ICD-10-CM | POA: Diagnosis not present

## 2024-02-04 DIAGNOSIS — R519 Headache, unspecified: Secondary | ICD-10-CM | POA: Diagnosis not present

## 2024-02-04 DIAGNOSIS — M62838 Other muscle spasm: Secondary | ICD-10-CM | POA: Diagnosis not present

## 2024-02-06 DIAGNOSIS — J019 Acute sinusitis, unspecified: Secondary | ICD-10-CM | POA: Diagnosis not present

## 2024-02-12 DIAGNOSIS — J4540 Moderate persistent asthma, uncomplicated: Secondary | ICD-10-CM | POA: Diagnosis not present

## 2024-02-12 DIAGNOSIS — I150 Renovascular hypertension: Secondary | ICD-10-CM | POA: Diagnosis not present

## 2024-02-12 DIAGNOSIS — J302 Other seasonal allergic rhinitis: Secondary | ICD-10-CM | POA: Diagnosis not present

## 2024-02-12 DIAGNOSIS — N184 Chronic kidney disease, stage 4 (severe): Secondary | ICD-10-CM | POA: Diagnosis not present

## 2024-02-12 DIAGNOSIS — N2581 Secondary hyperparathyroidism of renal origin: Secondary | ICD-10-CM | POA: Diagnosis not present

## 2024-02-17 DIAGNOSIS — J4540 Moderate persistent asthma, uncomplicated: Secondary | ICD-10-CM | POA: Diagnosis not present

## 2024-03-10 DIAGNOSIS — Z0100 Encounter for examination of eyes and vision without abnormal findings: Secondary | ICD-10-CM | POA: Diagnosis not present

## 2024-03-11 DIAGNOSIS — Z713 Dietary counseling and surveillance: Secondary | ICD-10-CM | POA: Diagnosis not present

## 2024-03-11 DIAGNOSIS — N189 Chronic kidney disease, unspecified: Secondary | ICD-10-CM | POA: Diagnosis not present

## 2024-03-11 DIAGNOSIS — N184 Chronic kidney disease, stage 4 (severe): Secondary | ICD-10-CM | POA: Diagnosis not present

## 2024-03-12 DIAGNOSIS — N281 Cyst of kidney, acquired: Secondary | ICD-10-CM | POA: Diagnosis not present

## 2024-03-12 DIAGNOSIS — N289 Disorder of kidney and ureter, unspecified: Secondary | ICD-10-CM | POA: Diagnosis not present

## 2024-03-12 DIAGNOSIS — N184 Chronic kidney disease, stage 4 (severe): Secondary | ICD-10-CM | POA: Diagnosis not present

## 2024-03-15 DIAGNOSIS — N184 Chronic kidney disease, stage 4 (severe): Secondary | ICD-10-CM | POA: Diagnosis not present

## 2024-03-15 DIAGNOSIS — N2581 Secondary hyperparathyroidism of renal origin: Secondary | ICD-10-CM | POA: Diagnosis not present

## 2024-03-15 DIAGNOSIS — I150 Renovascular hypertension: Secondary | ICD-10-CM | POA: Diagnosis not present

## 2024-03-31 DIAGNOSIS — N184 Chronic kidney disease, stage 4 (severe): Secondary | ICD-10-CM | POA: Diagnosis not present

## 2024-03-31 DIAGNOSIS — D631 Anemia in chronic kidney disease: Secondary | ICD-10-CM | POA: Diagnosis not present
